# Patient Record
Sex: Male | Born: 1935
Health system: Southern US, Community
[De-identification: ages and names within clinical notes are randomized; demographics above are authoritative.]

## PROBLEM LIST (undated history)

## (undated) DIAGNOSIS — K219 Gastro-esophageal reflux disease without esophagitis: Secondary | ICD-10-CM

## (undated) DIAGNOSIS — K4090 Unilateral inguinal hernia, without obstruction or gangrene, not specified as recurrent: Secondary | ICD-10-CM

## (undated) DIAGNOSIS — C189 Malignant neoplasm of colon, unspecified: Secondary | ICD-10-CM

## (undated) DIAGNOSIS — C61 Malignant neoplasm of prostate: Secondary | ICD-10-CM

## (undated) DIAGNOSIS — N400 Enlarged prostate without lower urinary tract symptoms: Secondary | ICD-10-CM

## (undated) DIAGNOSIS — I10 Essential (primary) hypertension: Secondary | ICD-10-CM

## (undated) HISTORY — DX: Gastro-esophageal reflux disease without esophagitis: K21.9

## (undated) HISTORY — DX: Essential (primary) hypertension: I10

## (undated) HISTORY — DX: Unilateral inguinal hernia, without obstruction or gangrene, not specified as recurrent: K40.90

## (undated) HISTORY — DX: Malignant neoplasm of colon, unspecified: C18.9

## (undated) HISTORY — DX: Benign prostatic hyperplasia without lower urinary tract symptoms: N40.0

---

## 1987-04-16 HISTORY — PX: PROSTATE SURGERY: SHX751

## 2002-12-31 ENCOUNTER — Ambulatory Visit: Admission: RE | Admit: 2002-12-31 | Discharge: 2003-03-31 | Payer: Self-pay | Admitting: Radiation Oncology

## 2004-04-15 HISTORY — PX: HERNIA REPAIR: SHX51

## 2005-04-15 HISTORY — PX: COLON SURGERY: SHX602

## 2007-04-16 HISTORY — PX: HERNIA REPAIR: SHX51

## 2011-12-24 ENCOUNTER — Encounter: Payer: Self-pay | Admitting: Hematology and Oncology

## 2011-12-24 DIAGNOSIS — C20 Malignant neoplasm of rectum: Secondary | ICD-10-CM

## 2011-12-24 DIAGNOSIS — C61 Malignant neoplasm of prostate: Secondary | ICD-10-CM

## 2012-06-16 ENCOUNTER — Encounter (INDEPENDENT_AMBULATORY_CARE_PROVIDER_SITE_OTHER): Payer: Self-pay | Admitting: *Deleted

## 2012-06-16 ENCOUNTER — Encounter (INDEPENDENT_AMBULATORY_CARE_PROVIDER_SITE_OTHER): Payer: Self-pay

## 2012-07-22 ENCOUNTER — Encounter: Payer: Self-pay | Admitting: Cardiology

## 2012-08-24 ENCOUNTER — Encounter: Payer: Self-pay | Admitting: *Deleted

## 2012-08-24 ENCOUNTER — Ambulatory Visit (INDEPENDENT_AMBULATORY_CARE_PROVIDER_SITE_OTHER): Payer: Medicare Other | Admitting: Cardiology

## 2012-08-24 ENCOUNTER — Encounter: Payer: Self-pay | Admitting: Cardiology

## 2012-08-24 ENCOUNTER — Other Ambulatory Visit: Payer: Self-pay | Admitting: *Deleted

## 2012-08-24 VITALS — BP 132/78 | HR 68 | Ht 70.0 in | Wt 180.0 lb

## 2012-08-24 DIAGNOSIS — R079 Chest pain, unspecified: Secondary | ICD-10-CM

## 2012-08-24 DIAGNOSIS — R072 Precordial pain: Secondary | ICD-10-CM

## 2012-08-24 DIAGNOSIS — R0989 Other specified symptoms and signs involving the circulatory and respiratory systems: Secondary | ICD-10-CM

## 2012-08-24 DIAGNOSIS — I10 Essential (primary) hypertension: Secondary | ICD-10-CM

## 2012-08-24 NOTE — Patient Instructions (Addendum)
Your physician recommends that you continue on your current medications as directed. Please refer to the Current Medication list given to you today. Your physician has requested that you have a lexiscan myoview. For further information please visit [email or web address] Please follow instruction sheet, as given. Your physician recommends that have lab work today for BNP at [facility].  We will call you with your results.

## 2012-08-24 NOTE — Progress Notes (Signed)
HPI The patient presents for  Evaluation of dyspnea and chest discomfort.he has no prior cardiac history. He was complaining of flulike symptoms in the fall. He was short of breath with this and though this has improved he has never gotten back to baseline. He might be short of breath by the time he gets up a flight of stairs. He's been sleeping on 2 pillows at night instead of one. He's had some mild lower extremity edema. When he does activities such as works in his yard he might get slightly dyspneic. He also describes some left upper chest discomfort. This seems to happen sporadically. There may be some discomfort in the left arm. He can't correlate this with the dyspnea with activities. He does not describe any jaw discomfort. He's had no palpitations, presyncope or syncope. He's not describing classic PND.   I did review the results of an echocardiogram done by VYAS,DHRUV B., MD.  This demonstrated some mild aortic insufficiency but overall well-preserved ejection fraction and no significant valvular abnormalities.  Allergies  Allergen Reactions  . Diclofenac   . Penicillins Itching    Current Outpatient Prescriptions  Medication Sig Dispense Refill  . acetaminophen (TYLENOL) 325 MG tablet Take 650 mg by mouth every 6 (six) hours as needed for pain.      Marland Kitchen amLODipine (NORVASC) 5 MG tablet Take 5 mg by mouth daily.      . cyclobenzaprine (FLEXERIL) 10 MG tablet Take 10 mg by mouth at bedtime as needed for muscle spasms.      . Multiple Vitamins-Minerals (CENTRUM SILVER PO) Take 1 tablet by mouth daily.      . pantoprazole (PROTONIX) 40 MG tablet Take 40 mg by mouth daily.       No current facility-administered medications for this visit.    Past Medical History  Diagnosis Date  . Hypertrophy (benign) of prostate   . Inguinal hernia without mention of obstruction or gangrene, unilateral or unspecified, (not specified as recurrent)   . Esophageal reflux   . Unspecified essential  hypertension   . Colon cancer     Past Surgical History  Procedure Laterality Date  . Colon surgery  2007    [facility]  . Hernia repair  2009    [facility]  . Prostate surgery  1989    enlarge prostate at [facility]  . Hernia repair  2006    Inguinal    Family History  Problem Relation Age of Onset  . Heart attack Father     cause of death at age 92  . Breast cancer Mother   . Cancer Brother     brain tumor  . Cancer - Other Son     stomach/esophagus    History   Social History  . Marital Status: Widowed    Spouse Name: N/A    Number of Children: 3  . Years of Education: N/A   Occupational History  . Not on file.   Social History Main Topics  . Smoking status: Former Smoker -- 0.50 packs/day for 3 years    Types: Cigarettes    Start date: 04/16/1951    Quit date: 04/15/1953  . Smokeless tobacco: Never Used  . Alcohol Use: No  . Drug Use: No  . Sexually Active: Not on file   Other Topics Concern  . Not on file   Social History Narrative  . No narrative on file    ROS:  Positive for reflux, joint pains, seasonal allergies. Otherwise as  stated in the HPI and negative for all other systems.  PHYSICAL EXAM BP 132/78  Pulse 68  Ht 5\' 10"  (1.778 m)  Wt 180 lb (81.647 kg)  BMI 25.83 kg/m2  SpO2 96% GENERAL:  Well appearing HEENT:  Pupils equal round and reactive, fundi not visualized, oral mucosa unremarkable NECK:  No jugular venous distention, waveform within normal limits, carotid upstroke brisk and symmetric, no bruits, no thyromegaly LYMPHATICS:  No cervical, inguinal adenopathy LUNGS:  Clear to auscultation bilaterally BACK:  No CVA tenderness CHEST:  Unremarkable HEART:  PMI not displaced or sustained,S1 and S2 within normal limits, no S3, no S4, no clicks, no rubs, no murmurs ABD:  Flat, positive bowel sounds normal in frequency in pitch, no bruits, no rebound, no guarding, no midline pulsatile mass, no hepatomegaly, no splenomegaly,  colostomy bag EXT:  2 plus pulses throughout, no edema, no cyanosis no clubbing SKIN:  No rashes no nodules NEURO:  Cranial nerves II through XII grossly intact, motor grossly intact throughout PSYCH:  Cognitively intact, oriented to person place and time  EKG:  Sinus rhythm, rate 71, axis within normal limits, intervals within normal limits, no acute ST-T wave changes.   ASSESSMENT AND PLAN  CHEST PAIN:  The etiology of this is not clear. He does have risk factors  Particularly with his family history. Therefore, the pretest probability of obstructive coronary disease is moderate. I will screen him with a stress perfusion study. He would not be a walk on a treadmill with his dyspnea and so he will have a [employer].   DYSPNEA:  I will screen with BNP.    HTN:  He will continue on the meds as listed.   Norvasc could be explaining some of the edema.

## 2012-08-26 DIAGNOSIS — R072 Precordial pain: Secondary | ICD-10-CM | POA: Insufficient documentation

## 2012-08-27 DIAGNOSIS — R079 Chest pain, unspecified: Secondary | ICD-10-CM

## 2012-08-31 ENCOUNTER — Telehealth: Payer: Self-pay | Admitting: *Deleted

## 2012-08-31 NOTE — Telephone Encounter (Signed)
Patient informed. 

## 2012-08-31 NOTE — Telephone Encounter (Signed)
Message copied by Eustace Moore on Mon Aug 31, 2012 10:52 AM ------      Message from: Rollene Rotunda      Created: Sun Aug 30, 2012  3:10 PM       BNP was normal.  The stress perfusion study was low risk.  There is artifact vs mild ischemia.  I doubt that further work up will be needed by I will see him in follow up to discuss the results.  Call [name] with the results and send results to VYAS,DHRUV B., MD       ------

## 2012-10-08 ENCOUNTER — Other Ambulatory Visit (INDEPENDENT_AMBULATORY_CARE_PROVIDER_SITE_OTHER): Payer: Self-pay | Admitting: *Deleted

## 2012-10-08 ENCOUNTER — Telehealth (INDEPENDENT_AMBULATORY_CARE_PROVIDER_SITE_OTHER): Payer: Self-pay | Admitting: *Deleted

## 2012-10-08 DIAGNOSIS — Z860100 Personal history of colon polyps, unspecified: Secondary | ICD-10-CM

## 2012-10-08 DIAGNOSIS — Z85048 Personal history of other malignant neoplasm of rectum, rectosigmoid junction, and anus: Secondary | ICD-10-CM

## 2012-10-08 DIAGNOSIS — Z1211 Encounter for screening for malignant neoplasm of colon: Secondary | ICD-10-CM

## 2012-10-08 NOTE — Telephone Encounter (Signed)
Patient needs movi prep 

## 2012-10-12 MED ORDER — PEG-KCL-NACL-NASULF-NA ASC-C 100 G PO SOLR: 1.0000 | Freq: Once | ORAL | Status: DC

## 2012-10-21 ENCOUNTER — Encounter: Payer: Self-pay | Admitting: Cardiology

## 2012-10-21 ENCOUNTER — Ambulatory Visit (INDEPENDENT_AMBULATORY_CARE_PROVIDER_SITE_OTHER): Payer: Medicare Other | Admitting: Cardiology

## 2012-10-21 VITALS — BP 145/85 | HR 81 | Ht 70.0 in | Wt 181.1 lb

## 2012-10-21 DIAGNOSIS — R072 Precordial pain: Secondary | ICD-10-CM

## 2012-10-21 NOTE — Patient Instructions (Addendum)

## 2012-10-21 NOTE — Progress Notes (Signed)
HPI The patient presents for evaluation of dyspnea and chest discomfort and edema as described in the previous note.  I did evaluate this with stress perfusion study in the spring. This was a low risk study with some possibility of inferior ischemia this is artifact. I brought him back to see whether he was having any increasing symptoms. He is not describing any chest pressure, neck or arm discomfort. Dyspnea he was having climbing the stairs is improved. He does get somewhat short of breath walking 100 yards uphill back from his mailbox. However, he has no resting complaints such as PND or orthopnea. Shoulder discomfort that he was having gradually has improved over time.  Allergies  Allergen Reactions  . Diclofenac   . Penicillins Itching    Current Outpatient Prescriptions  Medication Sig Dispense Refill  . acetaminophen (TYLENOL) 325 MG tablet Take 650 mg by mouth every 6 (six) hours as needed for pain.      Marland Kitchen amLODipine (NORVASC) 5 MG tablet Take 5 mg by mouth daily.      . cyclobenzaprine (FLEXERIL) 10 MG tablet Take 10 mg by mouth at bedtime as needed for muscle spasms.      . Multiple Vitamins-Minerals (CENTRUM SILVER PO) Take 1 tablet by mouth daily.      . pantoprazole (PROTONIX) 40 MG tablet Take 40 mg by mouth daily.      . peg 3350 powder (MOVIPREP) 100 G SOLR Take 1 kit (100 g total) by mouth once.  1 kit  0   No current facility-administered medications for this visit.    Past Medical History  Diagnosis Date  . Hypertrophy (benign) of prostate   . Inguinal hernia without mention of obstruction or gangrene, unilateral or unspecified, (not specified as recurrent)   . Esophageal reflux   . Unspecified essential hypertension   . Colon cancer     Past Surgical History  Procedure Laterality Date  . Colon surgery  2007    [place]  . Hernia repair  2009    [facility]  . Prostate surgery  1989    [facility]  . Hernia repair  2006   Inguinal    ROS: As stated in the HPI and negative for all other systems.  PHYSICAL EXAM BP 145/85  Pulse 81  Ht 5\' 10"  (1.778 m)  Wt 181 lb 1.6 oz (82.146 kg)  BMI 25.99 kg/m2 GENERAL:  Well appearing NECK:  No jugular venous distention, waveform within normal limits, carotid upstroke brisk and symmetric, no bruits, no thyromegaly LUNGS:  Clear to auscultation bilaterally CHEST:  Unremarkable HEART:  PMI not displaced or sustained,S1 and S2 within normal limits, no S3, no S4, no clicks, no rubs, no murmurs ABD:  Flat, positive bowel sounds normal in frequency in pitch, no bruits, no rebound, no guarding, no midline pulsatile mass, no hepatomegaly, no splenomegaly, colostomy bag EXT:  2 plus pulses throughout, no edema, no cyanosis no clubbing  ASSESSMENT AND PLAN  CHEST PAIN:  He is not describing any chest discomfort and his breathing is improved. This was a low risk study. Therefore, no further imaging or change in his therapy is indicated. However, I had a discussion that if he has increasing symptoms between now and his next appointment he should let us know. At that point he would likely need invasive evaluation.  DYSPNEA:  As above. BNP was normal. I do not strongly suspect a cardiac etiology.  HTN:  He will continue on the  meds as listed.

## 2012-10-27 ENCOUNTER — Telehealth (INDEPENDENT_AMBULATORY_CARE_PROVIDER_SITE_OTHER): Payer: Self-pay | Admitting: *Deleted

## 2012-10-27 NOTE — Telephone Encounter (Signed)
agree

## 2012-10-27 NOTE — Telephone Encounter (Signed)
  Procedure: tcs  Reason/Indication:  Hx polyps, hx rectal ca  Has patient had this procedure before?  Yes, 05/2009 (scanned)  If so, when, by whom and where?    Is there a family history of colon cancer?  no  Who?  What age when diagnosed?    Is patient diabetic?   no      Does patient have prosthetic heart valve?  no  Do you have a pacemaker?  no  Has patient ever had endocarditis? no  Has patient had joint replacement within last 12 months?  no  Is patient on Coumadin, Plavix and/or Aspirin? no  Medications: centrum silver daily, amlodipine 5 mg daily, omeprezole 40 mg daily, cyclobenzaprine 10 mg daily  Allergies: pcn  Medication Adjustment:   Procedure date & time: 11/26/12 at 1030

## 2012-11-12 ENCOUNTER — Encounter (HOSPITAL_COMMUNITY): Payer: Self-pay | Admitting: Pharmacy Technician

## 2012-11-26 ENCOUNTER — Ambulatory Visit (HOSPITAL_COMMUNITY)
Admission: RE | Admit: 2012-11-26 | Discharge: 2012-11-26 | Disposition: A | Discharge status: Home / Self Care | Payer: Medicare Other | Source: Ambulatory Visit | Attending: Internal Medicine | Admitting: Internal Medicine

## 2012-11-26 ENCOUNTER — Encounter (HOSPITAL_COMMUNITY)
Admission: RE | Disposition: A | Discharge status: Home / Self Care | Payer: Self-pay | Source: Ambulatory Visit | Attending: Internal Medicine

## 2012-11-26 ENCOUNTER — Encounter (HOSPITAL_COMMUNITY): Payer: Self-pay | Admitting: *Deleted

## 2012-11-26 DIAGNOSIS — D126 Benign neoplasm of colon, unspecified: Secondary | ICD-10-CM | POA: Insufficient documentation

## 2012-11-26 DIAGNOSIS — Z800 Family history of malignant neoplasm of digestive organs: Secondary | ICD-10-CM

## 2012-11-26 DIAGNOSIS — Z860100 Personal history of colon polyps, unspecified: Secondary | ICD-10-CM | POA: Insufficient documentation

## 2012-11-26 DIAGNOSIS — Z85048 Personal history of other malignant neoplasm of rectum, rectosigmoid junction, and anus: Secondary | ICD-10-CM

## 2012-11-26 DIAGNOSIS — I10 Essential (primary) hypertension: Secondary | ICD-10-CM | POA: Insufficient documentation

## 2012-11-26 DIAGNOSIS — Z933 Colostomy status: Secondary | ICD-10-CM | POA: Insufficient documentation

## 2012-11-26 DIAGNOSIS — Z79899 Other long term (current) drug therapy: Secondary | ICD-10-CM | POA: Insufficient documentation

## 2012-11-26 DIAGNOSIS — N400 Enlarged prostate without lower urinary tract symptoms: Secondary | ICD-10-CM | POA: Insufficient documentation

## 2012-11-26 DIAGNOSIS — K219 Gastro-esophageal reflux disease without esophagitis: Secondary | ICD-10-CM | POA: Insufficient documentation

## 2012-11-26 HISTORY — PX: COLONOSCOPY: SHX5424

## 2012-11-26 SURGERY — COLONOSCOPY
Anesthesia: Moderate Sedation

## 2012-11-26 MED ORDER — SODIUM CHLORIDE 0.9 % IV SOLN
INTRAVENOUS | Status: DC
  Administered 2012-11-26: 1000 mL via INTRAVENOUS

## 2012-11-26 MED ORDER — MIDAZOLAM HCL 5 MG/5ML IJ SOLN
INTRAMUSCULAR | Status: DC | PRN
  Administered 2012-11-26: 1 mg via INTRAVENOUS
  Administered 2012-11-26: 2 mg via INTRAVENOUS

## 2012-11-26 MED ORDER — MEPERIDINE HCL 50 MG/ML IJ SOLN
INTRAMUSCULAR | Status: DC | PRN
  Administered 2012-11-26: 25 mg via INTRAVENOUS

## 2012-11-26 MED ORDER — STERILE WATER FOR IRRIGATION IR SOLN
Status: DC | PRN
  Administered 2012-11-26: 11:00:00

## 2012-11-26 MED ORDER — MEPERIDINE HCL 50 MG/ML IJ SOLN
INTRAMUSCULAR | Status: AC
  Filled 2012-11-26: qty 1

## 2012-11-26 MED ORDER — MIDAZOLAM HCL 5 MG/5ML IJ SOLN
INTRAMUSCULAR | Status: AC
  Filled 2012-11-26: qty 10

## 2012-11-26 NOTE — H&P (Signed)
Joshua Wilkerson is an 79 y.o. male.   Chief Complaint: Patient is here for colonoscopy. HPI: Patient is a 19-year-old African male with history of rectal adenocarcinoma: Status post APR in 2007 followed by chemotherapy. He has remained in remission. Last colonoscopy was in February 2011 with removal of small polyp which was an adenoma. He denies melena or bleeding colostomy. However he did notice cannibal of blood this morning the patient colostomy bag. Family history is significant for colon carcinoma in her brother who is two years younger and is doing fine.  Past Medical History  Diagnosis Date  . Hypertrophy (benign) of prostate   . Inguinal hernia without mention of obstruction or gangrene, unilateral or unspecified, (not specified as recurrent)   . Esophageal reflux   . Unspecified essential hypertension   . Colon cancer     Past Surgical History  Procedure Laterality Date  . Colon surgery  2007    [place]  . Hernia repair  2009    Umb[facility]ical  . Prostate surgery  1989    enlarge prostate [facility]  . Hernia repair  2006    Inguinal    Family History  Problem Relation Age of Onset  . Heart attack Father     cause of death at age 64  . Breast cancer Mother   . Cancer Brother     brain tumor  . Cancer - Other Son     stomach/esophagus   Social History:  reports that he quit smoking about 59 years ago. His smoking use included Cigarettes. He started smoking about 61 years ago. He has a 1.5 pack-year smoking history. He has never used smokeless tobacco. He reports that he does not drink alcohol or use illicit drugs.  Allergies:  Allergies  Allergen Reactions  . Diclofenac   . Penicillins Itching    Medications Prior to Admission  Medication Sig Dispense Refill  . acetaminophen (TYLENOL) 325 MG tablet Take 325 mg by mouth every 6 (six) hours as needed for pain.       Marland Kitchen amLODipine (NORVASC) 5 MG tablet Take 5 mg by mouth daily.      . calcium carbonate (TUMS -  DOSED IN MG ELEMENTAL CALCIUM) 500 MG chewable tablet Chew 1 tablet by mouth daily as needed for heartburn.      . cyclobenzaprine (FLEXERIL) 10 MG tablet Take 10 mg by mouth at bedtime as needed for muscle spasms.      . Multiple Vitamins-Minerals (CENTRUM SILVER PO) Take 1 tablet by mouth daily.      . pantoprazole (PROTONIX) 40 MG tablet Take 40 mg by mouth daily.        No results found for this or any previous visit (from the past 48 hour(s)). No results found.  ROS  Pulse 71, temperature 97.6 F (36.4 C), temperature source Oral, resp. rate 20, height 5\' 10"  (1.778 m), weight 181 lb (82.101 kg), SpO2 94.00%. Physical Exam  Constitutional: He appears well-developed and well-nourished.  HENT:  Mouth/Throat: Oropharynx is clear and moist.  Eyes: Conjunctivae are normal. No scleral icterus.  Neck: No thyromegaly present.  Cardiovascular: Normal rate, regular rhythm and normal heart sounds.   No murmur heard. Respiratory: Effort normal and breath sounds normal.  GI: Soft. He exhibits no distension. There is no tenderness.  Colostomy located in left lower quadrant.  Musculoskeletal: He exhibits no edema.  Lymphadenopathy:    He has no cervical adenopathy.  Neurological: He is alert.  Skin: Skin is  warm and dry.     Assessment/Plan History of rectal adenocarcinoma. Surveillance colonoscopy.  Laruen Risser U 11/26/2012, 10:40 AM

## 2012-11-26 NOTE — Op Note (Addendum)
COLONOSCOPY PROCEDURE REPORT  PATIENT:  Joshua Wilkerson  MR#:  [id number] Birthdate:  August 04, 1927, 79 y.o., male Endoscopist:  Dr. Malissa Hippo, MD Referred By:  Dr. Ignatius Specking, MD Procedure Date: 11/26/2012  Procedure:   Colonoscopy  Indications: Patient is a 79 year old African male with history of rectal adenocarcinoma status post APR in 2007 who is here for surveillance colonoscopy. His last exam was in February 2011 with removal of small tubular adenoma.  Informed Consent:  The procedure and risks were reviewed with the patient and informed consent was obtained.  Medications:  Demerol 25 mg IV Versed 3 mg IV  Description of procedure: Patient was placed in supine position and digital exam of the colostomy was performed. No abnormality noted. The gastric Pantex colonoscope was placed into the colostomy and advanced to the area of the cecum, ileocecal valve and appendiceal orifice. The cecum was deeply intubated. These structures were well-seen and photographed for the record. From the level of the cecum and ileocecal valve, the scope was slowly and cautiously withdrawn. The mucosal surfaces were carefully surveyed utilizing scope tip to flexion to facilitate fold flattening as needed.  Findings:   Prep excellent. 3 mm polyps ablated via cold biopsy from transverse colon. Mucosa rest of the colon was normal.    Therapeutic/Diagnostic Maneuvers Performed:  See above  Complications:  None  Cecal Withdrawal Time:  11 minutes  Impression:  Examination performed to cecum via sigmoid colostomy. 3 mm polyps ablated via cold biopsy from transverse colon. No other abnormalities noted.  Recommendations:  Standard instructions given. I will be contacting patient with results of biopsy. He may consider another exam in 4 years as long as he remains in good health.  REHMAN,NAJEEB U  11/26/2012 11:12 AM  CC: Dr. Ignatius Specking., MD & Dr. Bonnetta Barry ref. provider found

## 2012-12-02 ENCOUNTER — Encounter (HOSPITAL_COMMUNITY): Payer: Self-pay | Admitting: Internal Medicine

## 2012-12-07 ENCOUNTER — Encounter (INDEPENDENT_AMBULATORY_CARE_PROVIDER_SITE_OTHER): Payer: Self-pay | Admitting: *Deleted

## 2013-01-15 DIAGNOSIS — Z8546 Personal history of malignant neoplasm of prostate: Secondary | ICD-10-CM

## 2013-01-15 DIAGNOSIS — Z85048 Personal history of other malignant neoplasm of rectum, rectosigmoid junction, and anus: Secondary | ICD-10-CM

## 2013-11-23 ENCOUNTER — Encounter: Payer: Self-pay | Admitting: Internal Medicine

## 2013-11-23 ENCOUNTER — Ambulatory Visit (INDEPENDENT_AMBULATORY_CARE_PROVIDER_SITE_OTHER): Payer: Medicare Other | Admitting: Cardiology

## 2013-11-23 ENCOUNTER — Encounter: Payer: Self-pay | Admitting: Cardiology

## 2013-11-23 VITALS — BP 144/82 | HR 62 | Ht 70.0 in | Wt 171.0 lb

## 2013-11-23 DIAGNOSIS — R072 Precordial pain: Secondary | ICD-10-CM

## 2013-11-23 DIAGNOSIS — I10 Essential (primary) hypertension: Secondary | ICD-10-CM

## 2013-11-23 NOTE — Assessment & Plan Note (Signed)
History of atypical thoracic discomfort as outlined above. Relatively low risk Cardiolite noted, no progression in symptoms. ECG is normal. Continue observation.

## 2013-11-23 NOTE — Progress Notes (Signed)
Clinical Summary [name] is an 80 y.o.male former patient of Dr. Percival Spanish, last seen in July 2014. His wife is my patient, Joshua Wilkerson. He has been followed with a history of chest pain and shortness of breath, prior overall low risk ischemic workup.  Lexiscan Cardiolite from May 2014 showed no diagnostic ST segment changes, moderate sized area of partial reversibility in the inferior wall suggestive of either variable soft tissue attenuation versus ischemia, LVEF 64%.  Echocardiogram done at [facility] in April 2014 revealed mild LVH with LVEF 28-31%, diastolic dysfunction, trace mitral regurgitation, mild aortic regurgitation, mild tricuspid regurgitation.  ECG today shows normal sinus rhythm. He does not report any exertional angina or limiting shortness of breath. Occasionally feels a tightness in his ankles in the mornings, no definitive orthopnea or PND. The chest discomfort that he describes to me is a very focal, sharp pain in his left shoulder area that he can reproduce with touch.   Allergies  Allergen Reactions  . Diclofenac   . Penicillins Itching    Current Outpatient Prescriptions  Medication Sig Dispense Refill  . acetaminophen (TYLENOL) 325 MG tablet Take 325 mg by mouth every 6 (six) hours as needed for pain.       Marland Kitchen amLODipine (NORVASC) 5 MG tablet Take 5 mg by mouth daily.      . Multiple Vitamins-Minerals (CENTRUM SILVER PO) Take 1 tablet by mouth daily.      . pantoprazole (PROTONIX) 40 MG tablet Take 40 mg by mouth daily.       No current facility-administered medications for this visit.    Past Medical History  Diagnosis Date  . BPH (benign prostatic hyperplasia)   . Inguinal hernia   . Esophageal reflux   . Essential hypertension, benign   . Colon cancer     Past Surgical History  Procedure Laterality Date  . Colon surgery  2007    [facility]  . Hernia repair  [id number]    [facility]  . Prostate surgery  1989    [facility]  . Hernia repair   2006    Inguinal  . Colonoscopy N/A 11/26/2012    Procedure: COLONOSCOPY;  Surgeon: Rogene Houston, MD;  Location: [facility];  Service: Endoscopy;  Laterality: N/A;    Family History  Problem Relation Age of Onset  . Heart attack Father     Cause of death at age 54  . Breast cancer Mother   . Cancer Brother     Brain tumor  . Cancer - Other Son     Stomach/esophagus    Social History [name] reports that he quit smoking about 60 years ago. His smoking use included Cigarettes. He started smoking about 62 years ago. He has a 1.5 pack-year smoking history. He has never used smokeless tobacco. [name] reports that he does not drink alcohol.  Review of Systems No palpitations, dizziness, syncope. No orthopnea or PND. Other systems reviewed and negative except as outlined.  Physical Examination Filed Vitals:   11/23/13 0943  BP: 144/82  Pulse: 62   Filed Weights   11/23/13 0943  Weight: 171 lb (77.565 kg)   Normally nourished appearing male, comfortable at rest. HEENT: Conjunctiva and lids normal, oropharynx clear. Neck: Supple, no elevated JVP or carotid bruits, no thyromegaly. Lungs: Clear to auscultation, nonlabored breathing at rest. Cardiac: Regular rate and rhythm, no S3 or significant systolic murmur, no pericardial rub. Abdomen: Soft, nontender, bowel sounds present, no guarding or  rebound. Extremities: Trace ankle edema, distal pulses 2+. Skin: Warm and dry. Musculoskeletal: No kyphosis. Neuropsychiatric: Alert and oriented x3, affect grossly appropriate.   Problem List and Plan   Precordial pain History of atypical thoracic discomfort as outlined above. Relatively low risk Cardiolite noted, no progression in symptoms. ECG is normal. Continue observation.  Essential hypertension, benign No change to current regimen, keep followup with Dr. Woody Seller.    Satira Sark, M.D., F.A.C.C.

## 2013-11-23 NOTE — Patient Instructions (Signed)

## 2013-11-23 NOTE — Assessment & Plan Note (Signed)
No change to current regimen, keep followup with Dr. Woody Seller.

## 2013-11-26 ENCOUNTER — Telehealth: Payer: Self-pay | Admitting: *Deleted

## 2013-11-26 NOTE — Telephone Encounter (Signed)
Wife informed

## 2013-11-26 NOTE — Telephone Encounter (Signed)
Message copied by Merlene Laughter on Fri Nov 26, 2013  9:42 AM ------      Message from: Satira Sark      Created: Tue Nov 23, 2013 10:59 AM       Patient seen in the office earlier today for a followup visit. Reviewed echocardiogram report. LVEF normal at 06-00% with diastolic dysfunction. No major valvular abnormalities. Mild aortic and tricuspid regurgitation. No change in current plan. ------

## 2014-11-28 ENCOUNTER — Ambulatory Visit: Payer: Medicare Other | Admitting: Cardiology

## 2014-11-29 ENCOUNTER — Ambulatory Visit (INDEPENDENT_AMBULATORY_CARE_PROVIDER_SITE_OTHER): Payer: Medicare Other | Admitting: Cardiology

## 2014-11-29 ENCOUNTER — Encounter: Payer: Self-pay | Admitting: *Deleted

## 2014-11-29 ENCOUNTER — Encounter: Payer: Self-pay | Admitting: Cardiology

## 2014-11-29 VITALS — BP 122/78 | HR 75 | Ht 68.0 in | Wt 177.0 lb

## 2014-11-29 DIAGNOSIS — I208 Other forms of angina pectoris: Secondary | ICD-10-CM

## 2014-11-29 DIAGNOSIS — I10 Essential (primary) hypertension: Secondary | ICD-10-CM | POA: Diagnosis not present

## 2014-11-29 NOTE — Progress Notes (Signed)
Cardiology Office Note  Date: 11/29/2014   ID: Joshua Wilkerson, DOB 01-10-1928, MRN [id number]  PCP: Glenda Chroman., MD  Primary Cardiologist: Rozann Lesches, MD   Chief Complaint  Patient presents with  . Cardiac follow-up    History of Present Illness: Joshua Wilkerson is an 81 y.o. male last seen in August 2015. He presents for a routine follow-up visit. Since we last met, he states that he has noticed more of a sense of exertional shortness of breath and some chest tightness, particularly when he walks at a quick pace or goes uphill. Prior cardiac testing includes a Cardiolite study from May 2014 that showed a partially reversible inferior defect consistent with either variable soft tissue attenuation or mild ischemia, LVEF was 64%. He has been managed medically since that time.  He walks in the morning up to get his paper every day, tries to stay active with other ADLs. He reports compliance with his medications which are outlined below.  Follow-up ECG today is reviewed.   Past Medical History  Diagnosis Date  . BPH (benign prostatic hyperplasia)   . Inguinal hernia   . Esophageal reflux   . Essential hypertension, benign   . Colon cancer     Past Surgical History  Procedure Laterality Date  . Colon surgery  2007    [facility]  . Hernia repair  4627    Umbilical  . Prostate surgery  1989    [facility]  . Hernia repair  2006    Inguinal  . Colonoscopy N/A 11/26/2012    Procedure: COLONOSCOPY;  Surgeon: Rogene Houston, MD;  [facility];  Service: Endoscopy;  Laterality: N/A;    Current Outpatient Prescriptions  Medication Sig Dispense Refill  . acetaminophen (TYLENOL) 325 MG tablet Take 325 mg by mouth every 6 (six) hours as needed for pain.     Marland Kitchen amLODipine (NORVASC) 5 MG tablet Take 5 mg by mouth daily.    . Multiple Vitamins-Minerals (CENTRUM SILVER PO) Take 1 tablet by mouth daily.    Marland Kitchen UNKNOWN TO PATIENT Acid reflux med 20 mg daily    . UNKNOWN TO  PATIENT Cholesterol med daily     No current facility-administered medications for this visit.    Allergies:  Diclofenac and Penicillins   Social History: The patient  reports that he quit smoking about 61 years ago. His smoking use included Cigarettes. He started smoking about 63 years ago. He has a 1.5 pack-year smoking history. He has never used smokeless tobacco. He reports that he does not drink alcohol or use illicit drugs.   ROS:  Please see the history of present illness. Otherwise, complete review of systems is positive for none.  All other systems are reviewed and negative.   Physical Exam: VS:  BP 122/78 mmHg  Pulse 75  Ht 5' 8"  (1.727 m)  Wt 177 lb (80.287 kg)  BMI 26.92 kg/m2  SpO2 98%, BMI Body mass index is 26.92 kg/(m^2).  Wt Readings from Last 3 Encounters:  11/29/14 177 lb (80.287 kg)  11/23/13 171 lb (77.565 kg)  11/26/12 181 lb (82.101 kg)     Normally nourished appearing male, comfortable at rest. HEENT: Conjunctiva and lids normal, oropharynx clear. Neck: Supple, no elevated JVP or carotid bruits, no thyromegaly. Lungs: Clear to auscultation, nonlabored breathing at rest. Cardiac: Regular rate and rhythm, no S3 or significant systolic murmur, no pericardial rub. Abdomen: Soft, nontender, bowel sounds present, no guarding or rebound. Extremities: Trace  ankle edema, distal pulses 2+. Skin: Warm and dry. Musculoskeletal: No kyphosis. Neuropsychiatric: Alert and oriented x3, affect grossly appropriate.   ECG: ECG is ordered today and shows sinus rhythm with nondiagnostic Q in lead 3.  Other Studies Reviewed Today:  Lexiscan Cardiolite from May 2014 showed no diagnostic ST segment changes, moderate sized area of partial reversibility in the inferior wall suggestive of either variable soft tissue attenuation versus ischemia, LVEF 64%.  Echocardiogram done at [facility] in April 2014 revealed mild LVH with LVEF 83-66%, diastolic dysfunction, trace  mitral regurgitation, mild aortic regurgitation, mild tricuspid regurgitation.  Assessment and Plan:  1. Increased sense of exertional shortness of breath and chest tightness, likely angina based on description. ECG is overall nonspecific. He had a low risk but somewhat equivocal Cardiolite in 2014. Plan will be to follow-up with an exercise Cardiolite to reassess ischemic burden and determine whether we need to pursue further evaluation or continue observation.  2. Essential hypertension, blood pressure control is good today.  Current medicines were reviewed with the patient today.   Orders Placed This Encounter  Procedures  . NM Myocar Multi W/Spect W/Wall Motion / EF  . Myocardial Perfusion Imaging  . EKG 12-Lead    Disposition: Follow-up tests results and determine disposition.   Signed, Satira Sark, MD, [facility] 11/29/2014 11:19 AM    [place] [facility] [place], [place], [place] [address] Phone: [phone]; Fax: [phone]

## 2014-11-29 NOTE — Patient Instructions (Signed)
Your physician recommends that you continue on your current medications as directed. Please refer to the Current Medication list given to you today. Your physician has requested that you have en exercise stress myoview. For further information please visit [email or web address]. Please follow instruction sheet, as given. Your physician recommends that you schedule a follow-up appointment in: 1 year. You will receive a reminder letter in the mail in about 10 months reminding you to call and schedule your appointment. If you don't receive this letter, please contact our office.

## 2014-12-12 ENCOUNTER — Encounter (HOSPITAL_COMMUNITY): Payer: Self-pay

## 2014-12-12 ENCOUNTER — Encounter (HOSPITAL_COMMUNITY)
Admission: RE | Admit: 2014-12-12 | Discharge: 2014-12-12 | Disposition: A | Discharge status: Home / Self Care | Payer: Medicare Other | Source: Ambulatory Visit | Attending: Cardiology | Admitting: Cardiology

## 2014-12-12 ENCOUNTER — Inpatient Hospital Stay (HOSPITAL_COMMUNITY): Admission: RE | Admit: 2014-12-12 | Payer: Medicare Other | Source: Ambulatory Visit

## 2014-12-12 DIAGNOSIS — I208 Other forms of angina pectoris: Secondary | ICD-10-CM | POA: Diagnosis present

## 2014-12-12 HISTORY — DX: Malignant neoplasm of prostate: C61

## 2014-12-12 LAB — NM MYOCAR MULTI W/SPECT W/WALL MOTION / EF
CHL CUP MPHR: 135 {beats}/min
CHL CUP NUCLEAR SRS: 6
CHL CUP RESTING HR STRESS: 54 {beats}/min
CSEPEDS: 35 s
CSEPHR: 88 %
Estimated workload: 4.6 METS
Exercise duration (min): 3 min
LHR: 0.31
LV sys vol: 23 mL
LVDIAVOL: 76 mL
NUC STRESS TID: 0.87
Peak HR: 120 {beats}/min
RPE: 14
SDS: 2
SSS: 8

## 2014-12-12 MED ORDER — TECHNETIUM TC 99M SESTAMIBI - CARDIOLITE
10.0000 | Freq: Once | INTRAVENOUS | Status: AC | PRN
  Administered 2014-12-12: 10:00:00 9 via INTRAVENOUS

## 2014-12-12 MED ORDER — SODIUM CHLORIDE 0.9 % IJ SOLN
INTRAMUSCULAR | Status: AC
  Administered 2014-12-12: 10 mL via INTRAVENOUS
  Filled 2014-12-12: qty 3

## 2014-12-12 MED ORDER — REGADENOSON 0.4 MG/5ML IV SOLN
INTRAVENOUS | Status: AC
  Filled 2014-12-12: qty 5

## 2014-12-12 MED ORDER — TECHNETIUM TC 99M SESTAMIBI GENERIC - CARDIOLITE
30.0000 | Freq: Once | INTRAVENOUS | Status: AC | PRN
  Administered 2014-12-12: 30 via INTRAVENOUS

## 2014-12-14 ENCOUNTER — Telehealth: Payer: Self-pay | Admitting: *Deleted

## 2014-12-14 NOTE — Telephone Encounter (Signed)
-----   Message from Satira Sark, MD sent at 12/12/2014  1:25 PM EDT ----- Reviewed report. Overall low risk study without ischemia and normal LVEF. Unless his symptoms worsen, would continue observation on medical therapy.

## 2014-12-14 NOTE — Telephone Encounter (Signed)
Pt aware, routed to pcp 

## 2015-04-28 DIAGNOSIS — Z125 Encounter for screening for malignant neoplasm of prostate: Secondary | ICD-10-CM | POA: Diagnosis not present

## 2015-04-28 DIAGNOSIS — R5383 Other fatigue: Secondary | ICD-10-CM | POA: Diagnosis not present

## 2015-04-28 DIAGNOSIS — Z0000 Encounter for general adult medical examination without abnormal findings: Secondary | ICD-10-CM | POA: Diagnosis not present

## 2015-04-28 DIAGNOSIS — E7800 Pure hypercholesterolemia, unspecified: Secondary | ICD-10-CM | POA: Diagnosis not present

## 2015-04-28 DIAGNOSIS — Z1389 Encounter for screening for other disorder: Secondary | ICD-10-CM | POA: Diagnosis not present

## 2015-04-28 DIAGNOSIS — Z7189 Other specified counseling: Secondary | ICD-10-CM | POA: Diagnosis not present

## 2015-04-28 DIAGNOSIS — Z1211 Encounter for screening for malignant neoplasm of colon: Secondary | ICD-10-CM | POA: Diagnosis not present

## 2015-04-28 DIAGNOSIS — Z6826 Body mass index (BMI) 26.0-26.9, adult: Secondary | ICD-10-CM | POA: Diagnosis not present

## 2015-04-28 DIAGNOSIS — Z79899 Other long term (current) drug therapy: Secondary | ICD-10-CM | POA: Diagnosis not present

## 2015-04-28 DIAGNOSIS — Z418 Encounter for other procedures for purposes other than remedying health state: Secondary | ICD-10-CM | POA: Diagnosis not present

## 2015-07-06 DIAGNOSIS — N400 Enlarged prostate without lower urinary tract symptoms: Secondary | ICD-10-CM | POA: Diagnosis not present

## 2015-07-06 DIAGNOSIS — I10 Essential (primary) hypertension: Secondary | ICD-10-CM | POA: Diagnosis not present

## 2015-07-06 DIAGNOSIS — Z789 Other specified health status: Secondary | ICD-10-CM | POA: Diagnosis not present

## 2015-07-06 DIAGNOSIS — M542 Cervicalgia: Secondary | ICD-10-CM | POA: Diagnosis not present

## 2015-07-06 DIAGNOSIS — F0390 Unspecified dementia without behavioral disturbance: Secondary | ICD-10-CM | POA: Diagnosis not present

## 2015-08-25 DIAGNOSIS — Z299 Encounter for prophylactic measures, unspecified: Secondary | ICD-10-CM | POA: Diagnosis not present

## 2015-08-25 DIAGNOSIS — Z789 Other specified health status: Secondary | ICD-10-CM | POA: Diagnosis not present

## 2015-08-25 DIAGNOSIS — J069 Acute upper respiratory infection, unspecified: Secondary | ICD-10-CM | POA: Diagnosis not present

## 2015-09-05 DIAGNOSIS — F0390 Unspecified dementia without behavioral disturbance: Secondary | ICD-10-CM | POA: Diagnosis not present

## 2015-09-05 DIAGNOSIS — I10 Essential (primary) hypertension: Secondary | ICD-10-CM | POA: Diagnosis not present

## 2015-09-12 DIAGNOSIS — E7800 Pure hypercholesterolemia, unspecified: Secondary | ICD-10-CM | POA: Diagnosis not present

## 2015-09-12 DIAGNOSIS — I10 Essential (primary) hypertension: Secondary | ICD-10-CM | POA: Diagnosis not present

## 2015-09-13 DIAGNOSIS — Z299 Encounter for prophylactic measures, unspecified: Secondary | ICD-10-CM | POA: Diagnosis not present

## 2015-09-13 DIAGNOSIS — H6120 Impacted cerumen, unspecified ear: Secondary | ICD-10-CM | POA: Diagnosis not present

## 2015-10-04 DIAGNOSIS — F0390 Unspecified dementia without behavioral disturbance: Secondary | ICD-10-CM | POA: Diagnosis not present

## 2015-10-04 DIAGNOSIS — I10 Essential (primary) hypertension: Secondary | ICD-10-CM | POA: Diagnosis not present

## 2015-10-04 DIAGNOSIS — Z299 Encounter for prophylactic measures, unspecified: Secondary | ICD-10-CM | POA: Diagnosis not present

## 2015-10-04 DIAGNOSIS — R079 Chest pain, unspecified: Secondary | ICD-10-CM | POA: Diagnosis not present

## 2015-10-19 ENCOUNTER — Encounter: Payer: Self-pay | Admitting: Cardiology

## 2015-10-19 ENCOUNTER — Ambulatory Visit (INDEPENDENT_AMBULATORY_CARE_PROVIDER_SITE_OTHER): Payer: Medicare Other | Admitting: Cardiology

## 2015-10-19 VITALS — BP 143/67 | HR 66 | Ht 68.0 in | Wt 158.6 lb

## 2015-10-19 DIAGNOSIS — Z889 Allergy status to unspecified drugs, medicaments and biological substances status: Secondary | ICD-10-CM | POA: Diagnosis not present

## 2015-10-19 DIAGNOSIS — I208 Other forms of angina pectoris: Secondary | ICD-10-CM

## 2015-10-19 DIAGNOSIS — I10 Essential (primary) hypertension: Secondary | ICD-10-CM | POA: Diagnosis not present

## 2015-10-19 DIAGNOSIS — Z789 Other specified health status: Secondary | ICD-10-CM

## 2015-10-19 NOTE — Patient Instructions (Signed)

## 2015-10-19 NOTE — Progress Notes (Signed)
Cardiology Office Note  Date: 10/19/2015   ID: Joshua Wilkerson, DOB 10-20-1927, MRN [id number]  PCP: Joshua Chroman, MD  Primary Cardiologist: Joshua Lesches, MD   Chief Complaint  Patient presents with  . Cardiac follow-up    History of Present Illness: Joshua Wilkerson is an 82 y.o. male last seen in August 2016. At that time we discussed a follow-up exercise Cardiolite to reassess ischemic burden with symptoms of exertional shortness of breath and probable angina. This study was overall low risk without clear evidence of ischemia.  He comes in today with a family member, his wife just recently passed away earlier in the week. He seems to be grieving appropriately, actually reports much less of a feeling of stress and anxiety since she is at peace now. He had been experiencing recent angina symptoms in the setting of substantial stress with poor sleep and diet. These have resolved.  We reviewed his medications. He stopped Lipitor with concerns about side effects, leg weakness and fatigue. This has improved off the medication. He was only taking Lipitor 10 mg daily.  Past Medical History  Diagnosis Date  . BPH (benign prostatic hyperplasia)   . Inguinal hernia   . Esophageal reflux   . Essential hypertension, benign   . Colon cancer ([place])   . Prostate cancer [facility])     Current Outpatient Prescriptions  Medication Sig Dispense Refill  . acetaminophen (TYLENOL) 325 MG tablet Take 325 mg by mouth every 6 (six) hours as needed for pain.     Marland Kitchen amLODipine (NORVASC) 5 MG tablet Take 5 mg by mouth daily.    . Multiple Vitamins-Minerals (CENTRUM SILVER PO) Take 1 tablet by mouth daily.    Marland Kitchen omeprazole (PRILOSEC) 20 MG capsule Take 20 mg by mouth daily.     No current facility-administered medications for this visit.   Allergies:  Diclofenac and Penicillins   Social History: The patient  reports that he quit smoking about 62 years ago. His smoking use included Cigarettes. He started  smoking about 64 years ago. He has a 1.5 pack-year smoking history. He has never used smokeless tobacco. He reports that he does not drink alcohol or use illicit drugs.   ROS:  Please see the history of present illness. Otherwise, complete review of systems is positive for fatigue.  All other systems are reviewed and negative.   Physical Exam: VS:  BP 143/67 mmHg  Pulse 66  Ht 5\' 8"  (1.727 m)  Wt 158 lb 9.6 oz (71.94 kg)  BMI 24.12 kg/m2  SpO2 98%, BMI Body mass index is 24.12 kg/(m^2).  Wt Readings from Last 3 Encounters:  10/19/15 158 lb 9.6 oz (71.94 kg)  11/29/14 177 lb (80.287 kg)  11/23/13 171 lb (77.565 kg)    Normally nourished appearing male, comfortable at rest. HEENT: Conjunctiva and lids normal, oropharynx clear. Neck: Supple, no elevated JVP or carotid bruits, no thyromegaly. Lungs: Clear to auscultation, nonlabored breathing at rest. Cardiac: Regular rate and rhythm, no S3 or significant systolic murmur, no pericardial rub. Abdomen: Soft, nontender, bowel sounds present, no guarding or rebound. Extremities: Trace ankle edema, distal pulses 2+.  ECG: I personally reviewed the tracing from 11/29/2014 which showed sinus rhythm with nondiagnostic Q in lead III.  Other Studies Reviewed Today:  Exercise Cardiolite 12/12/2014:  There was no ST segment deviation noted during stress.  The study is normal. There are no defects to suggest infarct or ischemia  This is a low risk  study.  The left ventricular ejection fraction is hyperdynamic (>65%).  Echocardiogram [facility]) April 2014: Mild LVH with LVEF 0000000, diastolic dysfunction, trace mitral regurgitation, mild aortic regurgitation, mild tricuspid regurgitation.  Assessment and Plan:  1. History of angina symptoms and fatigue, reported to be better at this time. His last stress test in August 2016 was low risk without clear evidence of scar or ischemia. He has had substantial psychological stress over  the last few years with the declining health for this wife, she passed away earlier this week. We will plan to keep an eye on him and if symptoms recur with regularity consider follow-up testing.  2. Essential hypertension, no changes to present regimen, keep follow-up with Joshua Wilkerson.  3. Intolerance to Lipitor.  Current medicines were reviewed with the patient today.  Disposition: Follow-up with be in 6 months.  Signed, Joshua Sark, MD, [facility] 10/19/2015 12:01 PM    [place] at [place], [place], [place] [address] Phone: [phone]; Fax: [phone]

## 2015-11-30 ENCOUNTER — Encounter (INDEPENDENT_AMBULATORY_CARE_PROVIDER_SITE_OTHER): Payer: Self-pay | Admitting: Internal Medicine

## 2015-12-14 DIAGNOSIS — E7800 Pure hypercholesterolemia, unspecified: Secondary | ICD-10-CM | POA: Diagnosis not present

## 2015-12-14 DIAGNOSIS — I10 Essential (primary) hypertension: Secondary | ICD-10-CM | POA: Diagnosis not present

## 2015-12-25 DIAGNOSIS — Z23 Encounter for immunization: Secondary | ICD-10-CM | POA: Diagnosis not present

## 2016-02-12 DIAGNOSIS — I10 Essential (primary) hypertension: Secondary | ICD-10-CM | POA: Diagnosis not present

## 2016-02-12 DIAGNOSIS — E7800 Pure hypercholesterolemia, unspecified: Secondary | ICD-10-CM | POA: Diagnosis not present

## 2016-03-05 DIAGNOSIS — I10 Essential (primary) hypertension: Secondary | ICD-10-CM | POA: Diagnosis not present

## 2016-03-05 DIAGNOSIS — E7800 Pure hypercholesterolemia, unspecified: Secondary | ICD-10-CM | POA: Diagnosis not present

## 2016-03-14 DIAGNOSIS — Z8546 Personal history of malignant neoplasm of prostate: Secondary | ICD-10-CM | POA: Diagnosis not present

## 2016-03-19 ENCOUNTER — Encounter (INDEPENDENT_AMBULATORY_CARE_PROVIDER_SITE_OTHER): Payer: Self-pay | Admitting: *Deleted

## 2016-03-19 ENCOUNTER — Encounter (INDEPENDENT_AMBULATORY_CARE_PROVIDER_SITE_OTHER): Payer: Self-pay | Admitting: Internal Medicine

## 2016-03-19 ENCOUNTER — Encounter (INDEPENDENT_AMBULATORY_CARE_PROVIDER_SITE_OTHER): Payer: Self-pay

## 2016-03-19 ENCOUNTER — Ambulatory Visit (INDEPENDENT_AMBULATORY_CARE_PROVIDER_SITE_OTHER): Payer: Medicare Other | Admitting: Internal Medicine

## 2016-03-19 VITALS — BP 118/70 | HR 63 | Temp 97.4°F | Resp 18 | Ht 70.0 in | Wt 156.1 lb

## 2016-03-19 DIAGNOSIS — I208 Other forms of angina pectoris: Secondary | ICD-10-CM

## 2016-03-19 DIAGNOSIS — Z85038 Personal history of other malignant neoplasm of large intestine: Secondary | ICD-10-CM

## 2016-03-19 DIAGNOSIS — R1310 Dysphagia, unspecified: Secondary | ICD-10-CM

## 2016-03-19 DIAGNOSIS — R1319 Other dysphagia: Secondary | ICD-10-CM

## 2016-03-19 MED ORDER — DOCUSATE SODIUM 100 MG PO CAPS: 200.0000 mg | ORAL_CAPSULE | Freq: Every day | ORAL | 0 refills | Status: AC

## 2016-03-19 NOTE — Patient Instructions (Addendum)
Physician will call with results of esophagogram when completed. If abdominal  griping does not improve with stool softener will consider colonoscopy otherwise wait 1 year

## 2016-03-19 NOTE — Progress Notes (Signed)
Presenting complaint;  History of colon cancer. Patient complains of dysphagia.  Database and Subjective:  Patient is 82 year old African-American male who is here for scheduled visit. He has history of colon carcinoma. He underwent APR in 2007. He has been undergoing periodic colonoscopies. Last exam was in August 2014 with removal of single small polyp from transverse colon and it was tubular adenoma. It was decided to see him in the office in 3 years determine whether he should undergo further colonoscopies. He states he has had rough few months. He lost his wife in July 2017. He lost 13 pounds. He states his appetite has improved and weight loss has leveled off. He denies melena or bleeding into colostomy. He has noted some discomfort/right pain on the left side that he's having a bowel movement. This symptom is bothersome to him. He also complains of dysphagia primarily to solids. This started 6 months ago. This symptom is intermittent. He points to upper sternum area site of bolus obstruction. He occasionally has difficulty with liquids if he takes a big gulp. Heartburn is well controlled with PPI. He takes Tums occasionally at night. He also complains of burning involving his feet and distal third of his legs. He is not having any difficulty ambulating. He lives alone. He stays busy and does yard work.      Current Medications: Outpatient Encounter Prescriptions as of 03/19/2016  Medication Sig  . acetaminophen (TYLENOL) 325 MG tablet Take 325 mg by mouth every 6 (six) hours as needed for pain.   Marland Kitchen amLODipine (NORVASC) 5 MG tablet Take 5 mg by mouth daily.  Marland Kitchen atorvastatin (LIPITOR) 10 MG tablet Take 10 mg by mouth daily.   Marland Kitchen donepezil (ARICEPT) 5 MG tablet Take 5 mg by mouth at bedtime.   . Multiple Vitamins-Minerals (CENTRUM SILVER PO) Take 1 tablet by mouth daily.  Marland Kitchen omeprazole (PRILOSEC) 20 MG capsule Take 20 mg by mouth daily.   No facility-administered encounter medications on  file as of 03/19/2016.      Objective: Blood pressure 118/70, pulse 63, temperature 97.4 F (36.3 C), temperature source Oral, resp. rate 18, height 5\' 10"  (1.778 m), weight 156 lb 1.6 oz (70.8 kg). Patient is alert and in no acute distress. Conjunctiva is pink. Sclera is nonicteric Oropharyngeal mucosa is normal. No neck masses or thyromegaly noted. Cardiac exam with regular rhythm normal S1 and S2. No murmur or gallop noted. Lungs are clear to auscultation. Abdomen is flat with colostomy at LLQ. Colostomy bag is soft brownish stool. Abdomen is soft and nontender without organomegaly or masses.  No LE edema or clubbing noted.    Assessment:  #1. History of colorectal cancer. He is status post APR in 2007. Last colonoscopy was in August 2014 with removal of small tubular adenoma. He is having intermittent discomfort with bowel movement. He does not have rectal bleeding. This symptom may be due to constipation. If he does not respond to colace will proceed with colonoscopy. #2. Dysphagia primarily to solids. Given history of GERD he may have Schatzki's ring or stricture. Given his age he may have esophageal motility disorder. #3. Paresthesias involving both distal legs and feet due to peripheral neuropathy   Plan:  Colace 100 mg by mouth twice a day. Barium pill esophagogram. Follow-up with Dr. Woody Seller regarding lower extremity symptoms. If abdominal pain does not improve with Colace will proceed with colonoscopy.

## 2016-03-27 ENCOUNTER — Ambulatory Visit (HOSPITAL_COMMUNITY)
Admission: RE | Admit: 2016-03-27 | Discharge: 2016-03-27 | Disposition: A | Discharge status: Home / Self Care | Payer: Medicare Other | Source: Ambulatory Visit | Attending: Internal Medicine | Admitting: Internal Medicine

## 2016-03-27 DIAGNOSIS — R1310 Dysphagia, unspecified: Secondary | ICD-10-CM | POA: Insufficient documentation

## 2016-03-27 DIAGNOSIS — R1319 Other dysphagia: Secondary | ICD-10-CM

## 2016-04-05 DIAGNOSIS — I10 Essential (primary) hypertension: Secondary | ICD-10-CM | POA: Diagnosis not present

## 2016-04-05 DIAGNOSIS — E7800 Pure hypercholesterolemia, unspecified: Secondary | ICD-10-CM | POA: Diagnosis not present

## 2016-04-11 ENCOUNTER — Encounter (INDEPENDENT_AMBULATORY_CARE_PROVIDER_SITE_OTHER): Payer: Self-pay | Admitting: Internal Medicine

## 2016-04-11 NOTE — Progress Notes (Signed)
Patient was given an appointment with Dr. Laural Golden for 07/16/16 at 11:00am.  A letter was mailed to the patient.

## 2016-04-18 ENCOUNTER — Ambulatory Visit (INDEPENDENT_AMBULATORY_CARE_PROVIDER_SITE_OTHER): Payer: Medicare Other | Admitting: Cardiology

## 2016-04-18 ENCOUNTER — Encounter: Payer: Self-pay | Admitting: Cardiology

## 2016-04-18 VITALS — BP 158/76 | HR 69 | Ht 70.0 in | Wt 157.6 lb

## 2016-04-18 DIAGNOSIS — I10 Essential (primary) hypertension: Secondary | ICD-10-CM

## 2016-04-18 DIAGNOSIS — K219 Gastro-esophageal reflux disease without esophagitis: Secondary | ICD-10-CM

## 2016-04-18 DIAGNOSIS — E782 Mixed hyperlipidemia: Secondary | ICD-10-CM

## 2016-04-18 DIAGNOSIS — I208 Other forms of angina pectoris: Secondary | ICD-10-CM | POA: Diagnosis not present

## 2016-04-18 DIAGNOSIS — I209 Angina pectoris, unspecified: Secondary | ICD-10-CM | POA: Diagnosis not present

## 2016-04-18 NOTE — Progress Notes (Signed)
.    Cardiology Office Note  Date: 04/18/2016   ID: Joshua Wilkerson, DOB December 15, 1927, MRN [id number]  PCP: Joshua Chroman, MD  Primary Cardiologist: Joshua Lesches, MD   Chief Complaint  Patient presents with  . Cardiac follow-up    History of Present Illness: Joshua Wilkerson is an 83 y.o. male last seen in July. He presents for a routine follow-up visit. Reports no progressive angina symptoms. States he has to rest sometimes with activities, but has had no worsening limitations. He reports some chest fullness that gets better when he belches.  Stress testing from last year is outlined below, overall low risk findings.  I reviewed his ECG today which shows sinus rhythm with borderline prolonged PR interval.  He states that he has cramping in his legs in the evenings, no definite claudication symptoms when he walks however.  I reviewed his medications which are outlined below in stable from a cardiac perspective. He is due for a physical with Dr. Woody Wilkerson soon.  Past Medical History:  Diagnosis Date  . BPH (benign prostatic hyperplasia)   . Colon cancer ([place])   . Esophageal reflux   . Essential hypertension, benign   . Inguinal hernia   . Prostate cancer [facility])     Past Surgical History:  Procedure Laterality Date  . COLON SURGERY  2007   [facility]  . COLONOSCOPY N/A 11/26/2012   Procedure: COLONOSCOPY;  Surgeon: Joshua Houston, MD;  [facility];  Service: Endoscopy;  Laterality: N/A;  . HERNIA REPAIR  [id number]   Umbilical  . HERNIA REPAIR  2006   Inguinal  . PROSTATE SURGERY  1989   [facility]    Current Outpatient Prescriptions  Medication Sig Dispense Refill  . acetaminophen (TYLENOL) 325 MG tablet Take 325 mg by mouth every 6 (six) hours as needed for pain.     Marland Kitchen amLODipine (NORVASC) 5 MG tablet Take 5 mg by mouth daily.    Marland Kitchen atorvastatin (LIPITOR) 10 MG tablet Take 10 mg by mouth daily.     Marland Kitchen docusate sodium (COLACE) 100 MG capsule Take 2 capsules (200 mg total)  by mouth at bedtime. 10 capsule 0  . donepezil (ARICEPT) 5 MG tablet Take 5 mg by mouth at bedtime.     . Multiple Vitamins-Minerals (CENTRUM SILVER PO) Take 1 tablet by mouth daily.    Marland Kitchen omeprazole (PRILOSEC) 20 MG capsule Take 20 mg by mouth daily.     No current facility-administered medications for this visit.    Allergies:  Diclofenac and Penicillins   Social History: The patient  reports that he quit smoking about 63 years ago. His smoking use included Cigarettes. He started smoking about 65 years ago. He has a 1.50 pack-year smoking history. He has never used smokeless tobacco. He reports that he does not drink alcohol or use drugs.   ROS:  Please see the history of present illness. Otherwise, complete review of systems is positive for hearing loss.  All other systems are reviewed and negative.   Physical Exam: VS:  BP (!) 158/76   Pulse 69   Ht 5\' 10"  (1.778 m)   Wt 157 lb 9.6 oz (71.5 kg)   SpO2 97%   BMI 22.61 kg/m , BMI Body mass index is 22.61 kg/m.  Wt Readings from Last 3 Encounters:  04/18/16 157 lb 9.6 oz (71.5 kg)  03/19/16 156 lb 1.6 oz (70.8 kg)  10/19/15 158 lb 9.6 oz (71.9 kg)  Normally nourished appearing male, comfortable at rest. HEENT: Conjunctiva and lids normal, oropharynx clear. Neck: Supple, no elevated JVP or carotid bruits, no thyromegaly. Lungs: Clear to auscultation, nonlabored breathing at rest. Cardiac: Regular rate and rhythm, no S3 or significant systolic murmur, no pericardial rub. Abdomen: Soft, nontender, bowel sounds present, no guarding or rebound. Extremities: No leg edema, distal pulses 2+. Skin: Warm and dry. Musculoskeletal: No kyphosis. Neuropsychiatric: Alert and oriented 3, affect appropriate.  ECG: I personally reviewed the tracing from 11/29/2014 which showed sinus rhythm with inferior Q wave.  Other Studies Reviewed Today:  Exercise Cardiolite 12/12/2014:  There was no ST segment deviation noted during stress.  The  study is normal. There are no defects to suggest infarct or ischemia  This is a low risk study.  The left ventricular ejection fraction is hyperdynamic (>65%).  Echocardiogram [facility]) April 2014: Mild LVH with LVEF 0000000, diastolic dysfunction, trace mitral regurgitation, mild aortic regurgitation, mild tricuspid regurgitation.  Assessment and Plan:  1. History of exertional angina, fewer symptoms than reported in the past. Cardiolite study from last year was low risk and we will plan to continue with observation for now. He is on Norvasc and Lipitor.  2. Essential hypertension, keep follow-up with Dr. Woody Wilkerson. No changes made to current regimen.  3. GERD, on Prilosec. Some of his chest discomfort is related to this as well.  4. Hyperlipidemia, on Lipitor. Due for follow-up lab work with Dr. Woody Wilkerson soon.  Current medicines were reviewed with the patient today.   Orders Placed This Encounter  Procedures  . EKG 12-Lead    Disposition: Follow-up in 6 months.  Signed, Joshua Sark, MD, [facility] 04/18/2016 1:23 PM    [place] [facility] [place], [place], [place] [address] Phone: [phone]; Fax: [phone]

## 2016-04-18 NOTE — Patient Instructions (Signed)

## 2016-05-10 DIAGNOSIS — Z1211 Encounter for screening for malignant neoplasm of colon: Secondary | ICD-10-CM | POA: Diagnosis not present

## 2016-05-10 DIAGNOSIS — Z299 Encounter for prophylactic measures, unspecified: Secondary | ICD-10-CM | POA: Diagnosis not present

## 2016-05-10 DIAGNOSIS — Z713 Dietary counseling and surveillance: Secondary | ICD-10-CM | POA: Diagnosis not present

## 2016-05-10 DIAGNOSIS — Z125 Encounter for screening for malignant neoplasm of prostate: Secondary | ICD-10-CM | POA: Diagnosis not present

## 2016-05-10 DIAGNOSIS — I10 Essential (primary) hypertension: Secondary | ICD-10-CM | POA: Diagnosis not present

## 2016-05-10 DIAGNOSIS — Z1389 Encounter for screening for other disorder: Secondary | ICD-10-CM | POA: Diagnosis not present

## 2016-05-10 DIAGNOSIS — Z0000 Encounter for general adult medical examination without abnormal findings: Secondary | ICD-10-CM | POA: Diagnosis not present

## 2016-05-10 DIAGNOSIS — Z7189 Other specified counseling: Secondary | ICD-10-CM | POA: Diagnosis not present

## 2016-05-10 DIAGNOSIS — Z79899 Other long term (current) drug therapy: Secondary | ICD-10-CM | POA: Diagnosis not present

## 2016-05-10 DIAGNOSIS — K219 Gastro-esophageal reflux disease without esophagitis: Secondary | ICD-10-CM | POA: Diagnosis not present

## 2016-05-10 DIAGNOSIS — R5383 Other fatigue: Secondary | ICD-10-CM | POA: Diagnosis not present

## 2016-05-10 DIAGNOSIS — E7800 Pure hypercholesterolemia, unspecified: Secondary | ICD-10-CM | POA: Diagnosis not present

## 2016-05-10 DIAGNOSIS — J069 Acute upper respiratory infection, unspecified: Secondary | ICD-10-CM | POA: Diagnosis not present

## 2016-05-13 DIAGNOSIS — I10 Essential (primary) hypertension: Secondary | ICD-10-CM | POA: Diagnosis not present

## 2016-05-13 DIAGNOSIS — E7800 Pure hypercholesterolemia, unspecified: Secondary | ICD-10-CM | POA: Diagnosis not present

## 2016-05-16 DIAGNOSIS — Z789 Other specified health status: Secondary | ICD-10-CM | POA: Diagnosis not present

## 2016-05-16 DIAGNOSIS — Z713 Dietary counseling and surveillance: Secondary | ICD-10-CM | POA: Diagnosis not present

## 2016-05-16 DIAGNOSIS — Z299 Encounter for prophylactic measures, unspecified: Secondary | ICD-10-CM | POA: Diagnosis not present

## 2016-05-16 DIAGNOSIS — Z6826 Body mass index (BMI) 26.0-26.9, adult: Secondary | ICD-10-CM | POA: Diagnosis not present

## 2016-05-16 DIAGNOSIS — R42 Dizziness and giddiness: Secondary | ICD-10-CM | POA: Diagnosis not present

## 2016-05-28 DIAGNOSIS — I10 Essential (primary) hypertension: Secondary | ICD-10-CM | POA: Diagnosis not present

## 2016-05-28 DIAGNOSIS — E7800 Pure hypercholesterolemia, unspecified: Secondary | ICD-10-CM | POA: Diagnosis not present

## 2016-07-16 ENCOUNTER — Encounter (INDEPENDENT_AMBULATORY_CARE_PROVIDER_SITE_OTHER): Payer: Self-pay | Admitting: Internal Medicine

## 2016-07-16 ENCOUNTER — Ambulatory Visit (INDEPENDENT_AMBULATORY_CARE_PROVIDER_SITE_OTHER): Payer: Medicare Other | Admitting: Internal Medicine

## 2016-07-16 ENCOUNTER — Encounter (INDEPENDENT_AMBULATORY_CARE_PROVIDER_SITE_OTHER): Payer: Self-pay

## 2016-07-16 VITALS — BP 130/76 | HR 66 | Temp 98.0°F | Resp 18 | Ht 70.0 in | Wt 152.8 lb

## 2016-07-16 DIAGNOSIS — I208 Other forms of angina pectoris: Secondary | ICD-10-CM

## 2016-07-16 DIAGNOSIS — K219 Gastro-esophageal reflux disease without esophagitis: Secondary | ICD-10-CM

## 2016-07-16 DIAGNOSIS — R1310 Dysphagia, unspecified: Secondary | ICD-10-CM

## 2016-07-16 DIAGNOSIS — Z85038 Personal history of other malignant neoplasm of large intestine: Secondary | ICD-10-CM

## 2016-07-16 DIAGNOSIS — R1319 Other dysphagia: Secondary | ICD-10-CM

## 2016-07-16 NOTE — Patient Instructions (Signed)
Can try decreasing omeprazole dose to every other day. He can go back to daily schedule if every other day results in breakthrough symptoms or heartburn. Call if swallowing difficulty gets worse.

## 2016-07-16 NOTE — Progress Notes (Signed)
Presenting complaint;  Follow-up for dysphagia  Database and Subjective:  Patient is 83 year old African-American male who was last seen in December 2017 for dysphagia. Barium pill esophagogram was obtained and it revealed impaired esophageal motility. Barium pill passed from oral cavity to stomach without any delay. Results were reviewed with patient over the phone and he was advised to monitor his symptoms, chew food thoroughly and eats slowly. He was advised to refrain from eating grilled meats. He was also advised to use Colace daily rather than when necessary.  He now returns for follow-up visit. He states he is having less difficulty swallowing. He eats slowly and chews food thoroughly. He states PPIs working. Bowels move daily. He denies melena or rectal bleeding. He has lost 4 pounds since his last visit. He believes he lost few pounds when he had cold over a month ago. He has fully recovered. He has not experienced any episodes of food impaction.    Current Medications: Outpatient Encounter Prescriptions as of 07/16/2016  Medication Sig  . acetaminophen (TYLENOL) 325 MG tablet Take 325 mg by mouth every 6 (six) hours as needed for pain.   Marland Kitchen amLODipine (NORVASC) 5 MG tablet Take 5 mg by mouth daily.  Marland Kitchen atorvastatin (LIPITOR) 10 MG tablet Take 10 mg by mouth. Patient takes 1 every night after supper.  . docusate sodium (COLACE) 100 MG capsule Take 2 capsules (200 mg total) by mouth at bedtime.  . donepezil (ARICEPT) 5 MG tablet Take 5 mg by mouth at bedtime.   . Ferrous Gluconate 324 (37.5 Fe) MG TABS Take 324 mg by mouth daily.  . Multiple Vitamins-Minerals (CENTRUM SILVER PO) Take 1 tablet by mouth daily.  Marland Kitchen omeprazole (PRILOSEC) 20 MG capsule Take 20 mg by mouth daily.   No facility-administered encounter medications on file as of 07/16/2016.      Objective: Blood pressure 130/76, pulse 66, temperature 98 F (36.7 C), temperature source Oral, resp. rate 18, height 5\' 10"   (1.778 m), weight 152 lb 12.8 oz (69.3 kg).  Patient is alert and in no acute distress. Conjunctiva is pink. Sclera is nonicteric Oropharyngeal mucosa is normal. He has partial upper and lower dental plates. No neck masses or thyromegaly noted. Cardiac exam with regular rhythm normal S1 and S2. No murmur or gallop noted. Lungs are clear to auscultation. Abdomen;  No LE edema or clubbing noted.  Labs/studies Results:  Barium pill esophagogram on 03/27/2016 No esophageal stricture noted. Barium pill passed from oral cavity to stomach without any delay. Diffuse impairment of esophageal motility noted with incomplete clearance of barium by primary peristalsis. Secondary survey is also noted.  Assessment:  #1. Dysphagia secondary to esophageal motility disorder. He is having less symptoms as he is eating slowly and not eating dry meats. Will continue to monitor his dysphagia. If symptoms worse may consider esophageal dilation. #2. GERD. He is doing well with daily dose of PPI. He may want to try every other day. #3. History of colon carcinoma. Last colonoscopy was in August 2014. #4.  Plan:  Try omeprazole every other day. Can go back to daily schedule if frequent heartburn experienced. Patient will call if dysphagia worsens. Office visit in one year.

## 2016-08-09 DIAGNOSIS — N400 Enlarged prostate without lower urinary tract symptoms: Secondary | ICD-10-CM | POA: Diagnosis not present

## 2016-08-09 DIAGNOSIS — M545 Low back pain: Secondary | ICD-10-CM | POA: Diagnosis not present

## 2016-08-09 DIAGNOSIS — R21 Rash and other nonspecific skin eruption: Secondary | ICD-10-CM | POA: Diagnosis not present

## 2016-08-09 DIAGNOSIS — Z6823 Body mass index (BMI) 23.0-23.9, adult: Secondary | ICD-10-CM | POA: Diagnosis not present

## 2016-08-09 DIAGNOSIS — Z299 Encounter for prophylactic measures, unspecified: Secondary | ICD-10-CM | POA: Diagnosis not present

## 2016-08-09 DIAGNOSIS — F419 Anxiety disorder, unspecified: Secondary | ICD-10-CM | POA: Diagnosis not present

## 2016-08-29 DIAGNOSIS — E7800 Pure hypercholesterolemia, unspecified: Secondary | ICD-10-CM | POA: Diagnosis not present

## 2016-08-29 DIAGNOSIS — I10 Essential (primary) hypertension: Secondary | ICD-10-CM | POA: Diagnosis not present

## 2016-10-15 NOTE — Progress Notes (Signed)
Cardiology Office Note  Date: 10/17/2016   ID: Joshua Wilkerson, DOB 1927/09/07, MRN [id number]  PCP: Glenda Chroman, MD  Primary Cardiologist: Rozann Lesches, MD   Chief Complaint  Patient presents with  . Cardiac follow-up    History of Present Illness: Joshua Wilkerson is an 83 y.o. male last seen in January. He presents for a routine follow-up visit. Since last encounter he does not report any progressive exertional angina symptoms. Has occasional reflux.  I reviewed his medications. He continues on Norvasc and Lipitor. He is following with Dr. Woody Seller for primary care.  Cardiolite study from 2016 is reviewed below. We have continued with medical therapy and observation.  Past Medical History:  Diagnosis Date  . BPH (benign prostatic hyperplasia)   . Colon cancer ([place])   . Esophageal reflux   . Essential hypertension, benign   . Inguinal hernia   . Prostate cancer [facility])     Past Surgical History:  Procedure Laterality Date  . COLON SURGERY  2007   [facility]  . COLONOSCOPY N/A 11/26/2012   Procedure: COLONOSCOPY;  Surgeon: Rogene Houston, MD;  [facility];  Service: Endoscopy;  Laterality: N/A;  . HERNIA REPAIR  [id number]   Umbilical  . HERNIA REPAIR  2006   Inguinal  . PROSTATE SURGERY  1989   [facility]    Current Outpatient Prescriptions  Medication Sig Dispense Refill  . acetaminophen (TYLENOL) 325 MG tablet Take 325 mg by mouth every 6 (six) hours as needed for pain.     Marland Kitchen amLODipine (NORVASC) 5 MG tablet Take 5 mg by mouth daily.    Marland Kitchen atorvastatin (LIPITOR) 10 MG tablet Take 10 mg by mouth. Patient takes 1 every night after supper.    . docusate sodium (COLACE) 100 MG capsule Take 2 capsules (200 mg total) by mouth at bedtime. 10 capsule 0  . donepezil (ARICEPT) 5 MG tablet Take 5 mg by mouth at bedtime.     . Ferrous Gluconate 324 (37.5 Fe) MG TABS Take 324 mg by mouth daily.    . Multiple Vitamins-Minerals (CENTRUM SILVER PO) Take 1 tablet by mouth daily.     Marland Kitchen omeprazole (PRILOSEC) 20 MG capsule Take 20 mg by mouth daily.     No current facility-administered medications for this visit.    Allergies:  Diclofenac and Penicillins   Social History: The patient  reports that he quit smoking about 63 years ago. His smoking use included Cigarettes. He started smoking about 65 years ago. He has a 1.50 pack-year smoking history. He has never used smokeless tobacco. He reports that he does not drink alcohol or use drugs.   ROS:  Please see the history of present illness. Otherwise, complete review of systems is positive for hearing loss, occasional leg cramps in the morning.  All other systems are reviewed and negative.   Physical Exam: VS:  BP 130/72   Pulse 73   Ht 5\' 10"  (1.778 m)   Wt 151 lb (68.5 kg)   SpO2 97%   BMI 21.67 kg/m , BMI Body mass index is 21.67 kg/m.  Wt Readings from Last 3 Encounters:  10/17/16 151 lb (68.5 kg)  07/16/16 152 lb 12.8 oz (69.3 kg)  04/18/16 157 lb 9.6 oz (71.5 kg)    Elderly male, comfortable at rest. HEENT: Conjunctiva and lids normal, oropharynx clear. Neck: Supple, no elevated JVP or carotid bruits, no thyromegaly. Lungs: Clear to auscultation, nonlabored breathing at rest. Cardiac: Regular  rate and rhythm, no S3 or significant systolic murmur, no pericardial rub. Abdomen: Soft, nontender, bowel sounds present, no guarding or rebound. Extremities: No leg edema, distal pulses 2+. Skin: Warm and dry. Musculoskeletal: No kyphosis. Neuropsychiatric: Alert and oriented 3, affect appropriate.  ECG: I personally reviewed the tracing from 04/18/2016 which showed normal sinus rhythm.  Other Studies Reviewed Today:  Exercise Cardiolite 12/12/2014:  There was no ST segment deviation noted during stress.  The study is normal. There are no defects to suggest infarct or ischemia  This is a low risk study.  The left ventricular ejection fraction is hyperdynamic (>65%).  Echocardiogram [facility]) April 2014: Mild LVH with LVEF 61-90%, diastolic dysfunction, trace mitral regurgitation, mild aortic regurgitation, mild tricuspid regurgitation.  Assessment and Plan:  1. Exertional angina, infrequent at this point on Norvasc and Lipitor. We are continuing with conservative management, low risk Cardiolite noted in 2016.  2. Essential hypertension, systolic blood pressure to 130s today. Continue Norvasc.  3. Hyperlipidemia on Lipitor. He is following with Dr. Woody Seller.  4. GERD on Prilosec. Reports intermittent reflux.  Current medicines were reviewed with the patient today.  Disposition: Follow-up in 6 months.  Signed, Satira Sark, MD, [facility] 10/17/2016 10:56 AM    [facility] at [place], [place], [place] [address] Phone: [phone]; Fax: [phone]

## 2016-10-17 ENCOUNTER — Encounter: Payer: Self-pay | Admitting: Cardiology

## 2016-10-17 ENCOUNTER — Ambulatory Visit (INDEPENDENT_AMBULATORY_CARE_PROVIDER_SITE_OTHER): Payer: Medicare Other | Admitting: Cardiology

## 2016-10-17 VITALS — BP 130/72 | HR 73 | Ht 70.0 in | Wt 151.0 lb

## 2016-10-17 DIAGNOSIS — I209 Angina pectoris, unspecified: Secondary | ICD-10-CM | POA: Diagnosis not present

## 2016-10-17 DIAGNOSIS — I10 Essential (primary) hypertension: Secondary | ICD-10-CM

## 2016-10-17 DIAGNOSIS — K219 Gastro-esophageal reflux disease without esophagitis: Secondary | ICD-10-CM

## 2016-10-17 DIAGNOSIS — E782 Mixed hyperlipidemia: Secondary | ICD-10-CM | POA: Diagnosis not present

## 2016-10-17 DIAGNOSIS — I208 Other forms of angina pectoris: Secondary | ICD-10-CM | POA: Diagnosis not present

## 2016-10-17 NOTE — Patient Instructions (Signed)

## 2016-11-18 DIAGNOSIS — H5203 Hypermetropia, bilateral: Secondary | ICD-10-CM | POA: Diagnosis not present

## 2016-11-18 DIAGNOSIS — H25813 Combined forms of age-related cataract, bilateral: Secondary | ICD-10-CM | POA: Diagnosis not present

## 2016-11-18 DIAGNOSIS — H52223 Regular astigmatism, bilateral: Secondary | ICD-10-CM | POA: Diagnosis not present

## 2016-11-18 DIAGNOSIS — H04123 Dry eye syndrome of bilateral lacrimal glands: Secondary | ICD-10-CM | POA: Diagnosis not present

## 2016-11-18 DIAGNOSIS — H524 Presbyopia: Secondary | ICD-10-CM | POA: Diagnosis not present

## 2016-12-06 DIAGNOSIS — E7800 Pure hypercholesterolemia, unspecified: Secondary | ICD-10-CM | POA: Diagnosis not present

## 2016-12-06 DIAGNOSIS — N400 Enlarged prostate without lower urinary tract symptoms: Secondary | ICD-10-CM | POA: Diagnosis not present

## 2016-12-06 DIAGNOSIS — Z299 Encounter for prophylactic measures, unspecified: Secondary | ICD-10-CM | POA: Diagnosis not present

## 2016-12-06 DIAGNOSIS — I10 Essential (primary) hypertension: Secondary | ICD-10-CM | POA: Diagnosis not present

## 2016-12-06 DIAGNOSIS — F0390 Unspecified dementia without behavioral disturbance: Secondary | ICD-10-CM | POA: Diagnosis not present

## 2016-12-06 DIAGNOSIS — J069 Acute upper respiratory infection, unspecified: Secondary | ICD-10-CM | POA: Diagnosis not present

## 2016-12-06 DIAGNOSIS — F419 Anxiety disorder, unspecified: Secondary | ICD-10-CM | POA: Diagnosis not present

## 2016-12-06 DIAGNOSIS — Z6823 Body mass index (BMI) 23.0-23.9, adult: Secondary | ICD-10-CM | POA: Diagnosis not present

## 2016-12-06 DIAGNOSIS — F515 Nightmare disorder: Secondary | ICD-10-CM | POA: Diagnosis not present

## 2017-01-16 DIAGNOSIS — Z23 Encounter for immunization: Secondary | ICD-10-CM | POA: Diagnosis not present

## 2017-05-16 DIAGNOSIS — N400 Enlarged prostate without lower urinary tract symptoms: Secondary | ICD-10-CM | POA: Diagnosis not present

## 2017-05-16 DIAGNOSIS — Z0000 Encounter for general adult medical examination without abnormal findings: Secondary | ICD-10-CM | POA: Diagnosis not present

## 2017-05-16 DIAGNOSIS — Z1339 Encounter for screening examination for other mental health and behavioral disorders: Secondary | ICD-10-CM | POA: Diagnosis not present

## 2017-05-16 DIAGNOSIS — Z125 Encounter for screening for malignant neoplasm of prostate: Secondary | ICD-10-CM | POA: Diagnosis not present

## 2017-05-16 DIAGNOSIS — Z1211 Encounter for screening for malignant neoplasm of colon: Secondary | ICD-10-CM | POA: Diagnosis not present

## 2017-05-16 DIAGNOSIS — Z1331 Encounter for screening for depression: Secondary | ICD-10-CM | POA: Diagnosis not present

## 2017-05-16 DIAGNOSIS — R5383 Other fatigue: Secondary | ICD-10-CM | POA: Diagnosis not present

## 2017-05-16 DIAGNOSIS — Z79899 Other long term (current) drug therapy: Secondary | ICD-10-CM | POA: Diagnosis not present

## 2017-05-16 DIAGNOSIS — K219 Gastro-esophageal reflux disease without esophagitis: Secondary | ICD-10-CM | POA: Diagnosis not present

## 2017-05-16 DIAGNOSIS — Z7189 Other specified counseling: Secondary | ICD-10-CM | POA: Diagnosis not present

## 2017-05-16 DIAGNOSIS — Z6823 Body mass index (BMI) 23.0-23.9, adult: Secondary | ICD-10-CM | POA: Diagnosis not present

## 2017-05-16 DIAGNOSIS — F0390 Unspecified dementia without behavioral disturbance: Secondary | ICD-10-CM | POA: Diagnosis not present

## 2017-05-16 DIAGNOSIS — Z299 Encounter for prophylactic measures, unspecified: Secondary | ICD-10-CM | POA: Diagnosis not present

## 2017-05-16 DIAGNOSIS — I10 Essential (primary) hypertension: Secondary | ICD-10-CM | POA: Diagnosis not present

## 2017-07-15 DIAGNOSIS — I10 Essential (primary) hypertension: Secondary | ICD-10-CM | POA: Diagnosis not present

## 2017-07-15 DIAGNOSIS — E7800 Pure hypercholesterolemia, unspecified: Secondary | ICD-10-CM | POA: Diagnosis not present

## 2017-07-29 ENCOUNTER — Ambulatory Visit (INDEPENDENT_AMBULATORY_CARE_PROVIDER_SITE_OTHER): Payer: Medicare Other | Admitting: Internal Medicine

## 2017-08-03 IMAGING — RF DG ESOPHAGUS
12 of 13 series · 14 of 24 positions shown · non-contrast
Comparison: None

CLINICAL DATA: Esophageal dysphagia

EXAM:
ESOPHOGRAM / BARIUM SWALLOW / BARIUM TABLET STUDY
TECHNIQUE: Combined double contrast and single contrast examination performed
using effervescent crystals, thick barium liquid, and thin barium
liquid. The patient was observed with fluoroscopy swallowing a 13 mm
barium sulphate tablet.
FLUOROSCOPY TIME:  Fluoroscopy Time:  1 minutes 18 seconds
Radiation Exposure Index (if provided by the fluoroscopic device):
11.9 mGy
Number of Acquired Spot Images: Multiple screen captures during
fluoroscopy

[Series 1: cp_standard · 0.19mm/px · 1 of 16 frames shown (1 of 12)]
[frame 3/16]
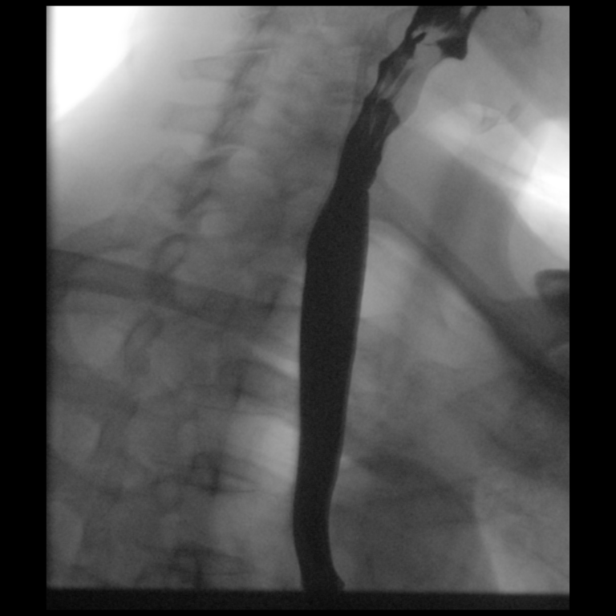

[Series 2: cp_standard · 0.19mm/px · 1 of 22 frames shown (2 of 12)]
[frame 4/22]
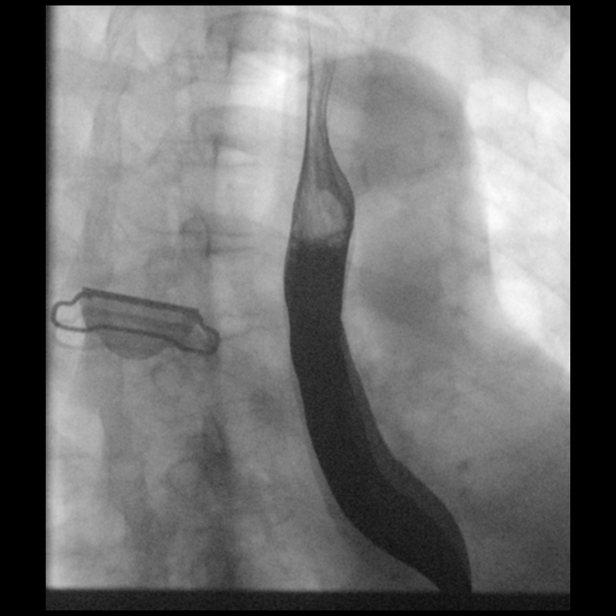

[Series 3: cp_standard · 0.19mm/px · 1 of 110 frames shown (3 of 12)]
[frame 56/110]
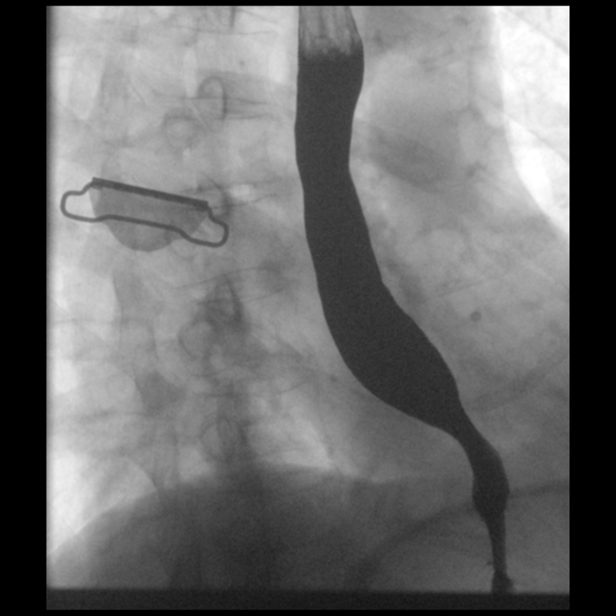

[Series 4: cp_standard · 0.19mm/px · 1 of 36 frames shown (4 of 12)]
[frame 25/36]
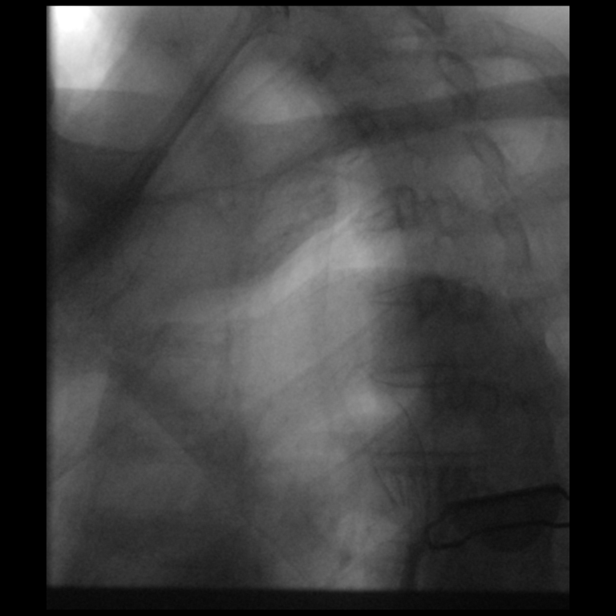

[Series 5: cp_standard · 0.19mm/px · 1 of 40 frames shown (5 of 12)]
[frame 7/40]
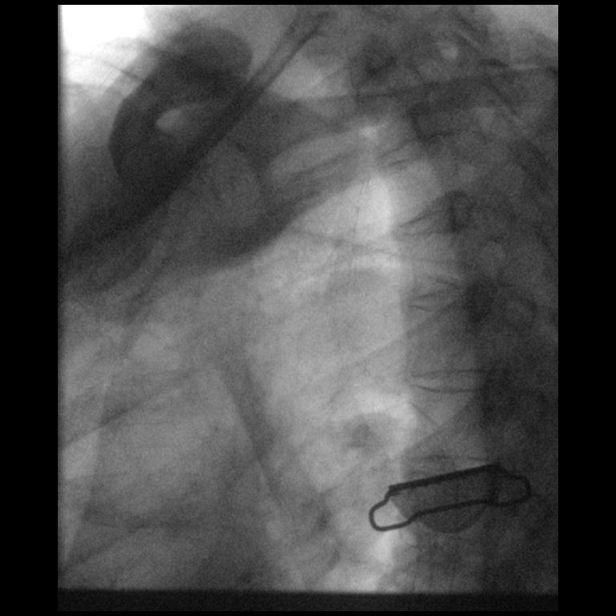

[Series 6: cp_standard · 0.19mm/px · 1 of 46 frames shown (6 of 12)]
[frame 1/46]
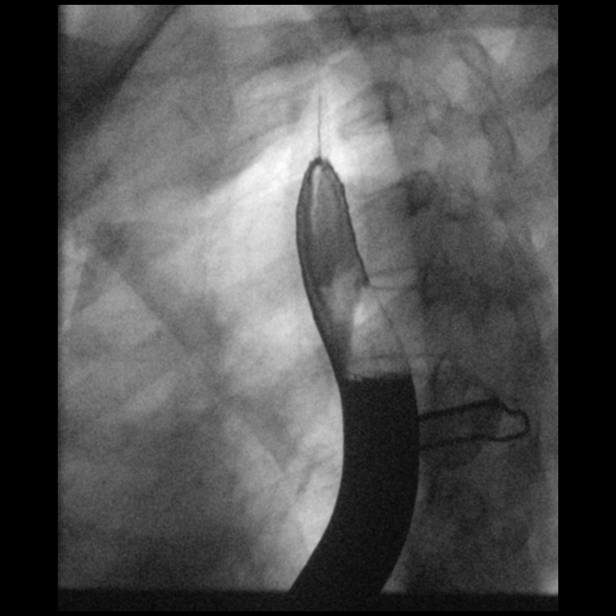

[Series 7: cp_standard · 0.20mm/px · 2 of 16 frames shown (7 of 12)]
[frame 2/16]
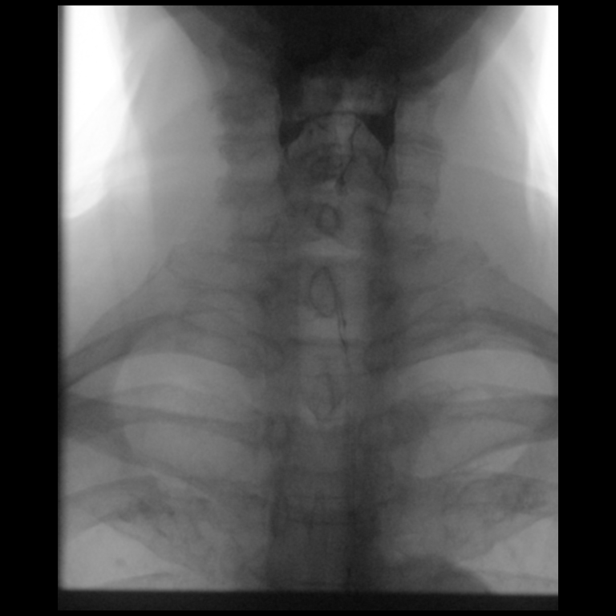
[frame 14/16]
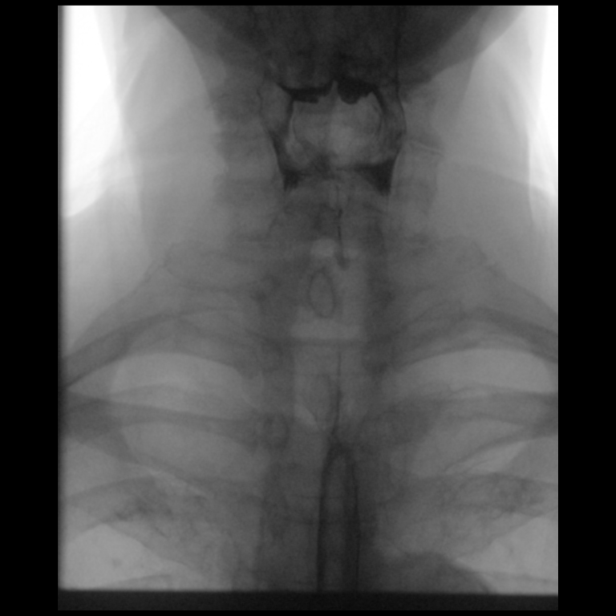

[Series 9: cp_standard · 0.18mm/px · 1 of 15 frames shown (8 of 12)]
[frame 8/15]
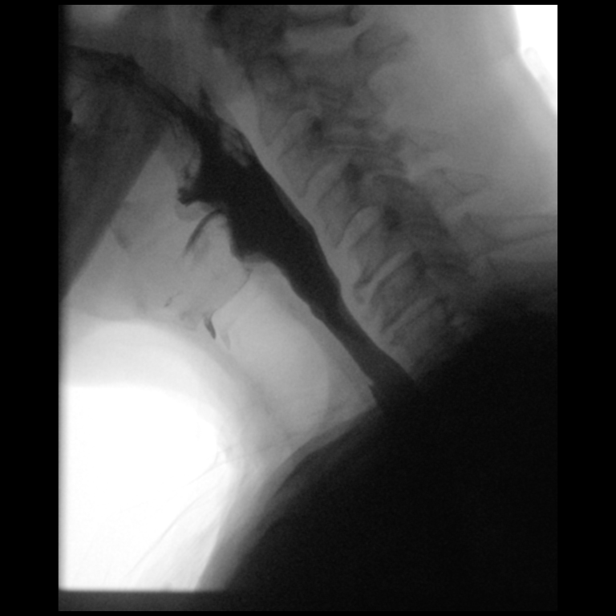

[Series 12: cp_standard · 0.19mm/px · 1 of 18 frames shown (9 of 12)]
[frame 1/18]
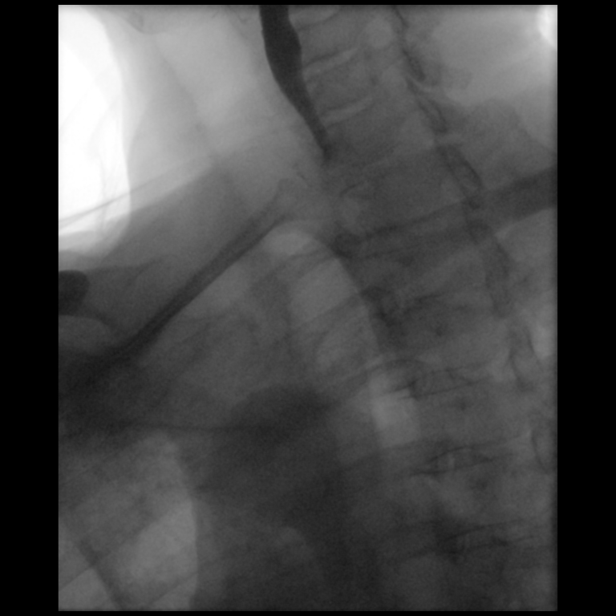

[Series 12: cp_standard · 0.19mm/px · 2 of 32 frames shown (10 of 12)]
[frame 2/32]
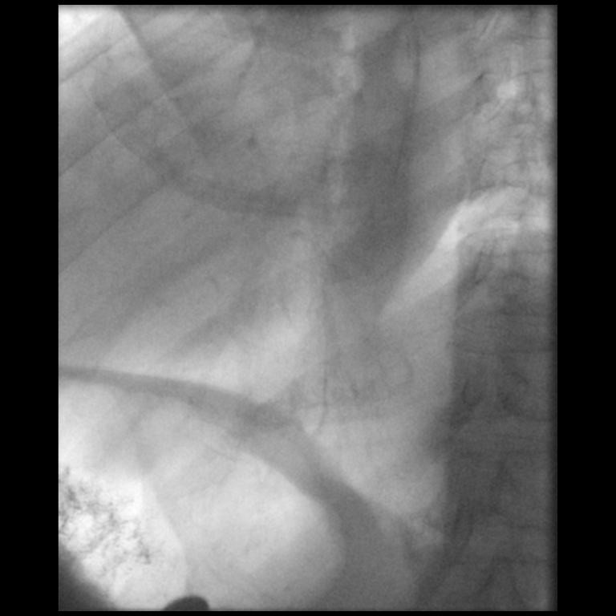
[frame 17/32]
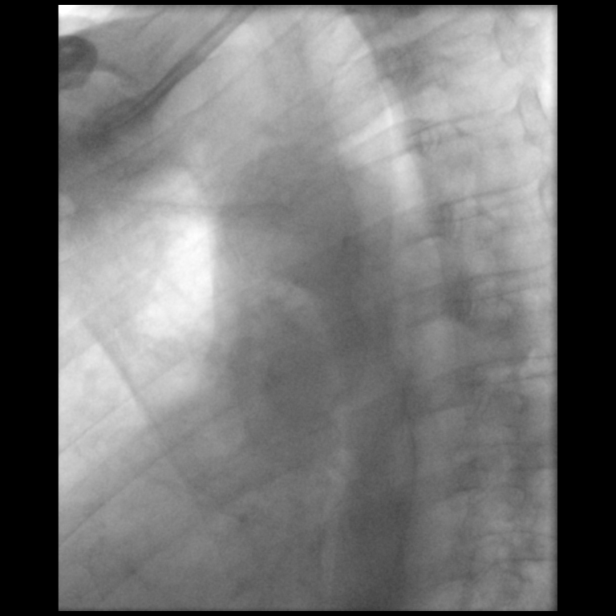

[Series 13: cp_standard · 0.19mm/px · 1 of 16 frames shown (11 of 12)]
[frame 15/16]
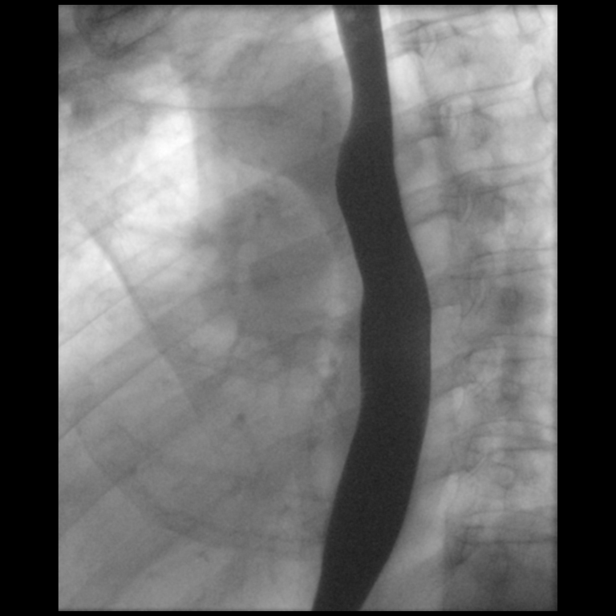

[Series 15: cp_standard · 0.19mm/px · 1 of 41 frames shown (12 of 12)]
[frame 35/41]
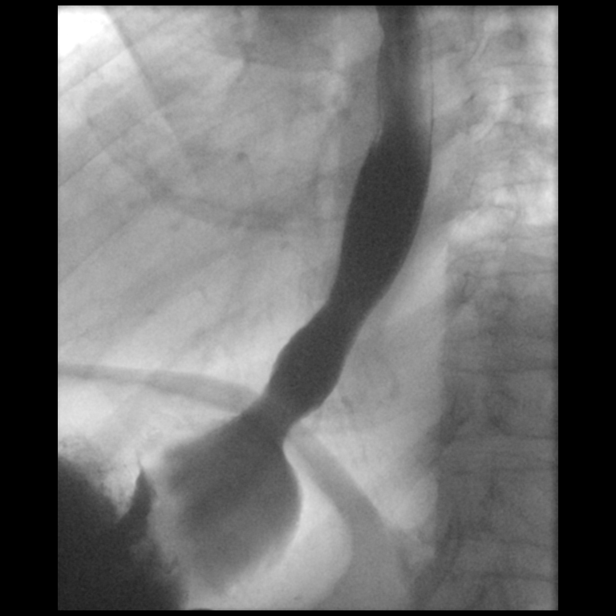

[14 of 24 positions shown; findings below may reference images not displayed]

FINDINGS: Normal esophageal distention.

No esophageal mass or stricture.

12.5 mm diameter barium tablet passed from oral cavity to stomach
without delay.

Diffuse age-related impairment of esophageal motility, mild in
degree, with incomplete clearance of barium by primary peristaltic
waves.

Scattered secondary waves noted.

No persistent intraluminal filling defects.

Targeted rapid sequence imaging of the cervical esophagus and
hypopharynx was performed.

Laryngeal penetration and aspiration of contrast into the proximal
trachea were identified.

No spontaneous cough reflex was seen.

Patient cleared contrast from the proximal trachea with voluntary
coughing.

Mild vallecular and piriform sinus residuals were identified.
IMPRESSION: Laryngeal penetration with aspiration of contrast into the proximal
trachea.

Absent spontaneous cough reflex, though contrast was cleared from
the proximal trachea after voluntary coughing.

Vallecular and piriform sinus residuals.

No evidence of esophageal mass or stricture.

## 2017-08-15 DIAGNOSIS — I10 Essential (primary) hypertension: Secondary | ICD-10-CM | POA: Diagnosis not present

## 2017-08-15 DIAGNOSIS — E7800 Pure hypercholesterolemia, unspecified: Secondary | ICD-10-CM | POA: Diagnosis not present

## 2017-08-15 DIAGNOSIS — R21 Rash and other nonspecific skin eruption: Secondary | ICD-10-CM | POA: Diagnosis not present

## 2017-08-15 DIAGNOSIS — F419 Anxiety disorder, unspecified: Secondary | ICD-10-CM | POA: Diagnosis not present

## 2017-08-15 DIAGNOSIS — R200 Anesthesia of skin: Secondary | ICD-10-CM | POA: Diagnosis not present

## 2017-08-15 DIAGNOSIS — Z6822 Body mass index (BMI) 22.0-22.9, adult: Secondary | ICD-10-CM | POA: Diagnosis not present

## 2017-08-15 DIAGNOSIS — Z299 Encounter for prophylactic measures, unspecified: Secondary | ICD-10-CM | POA: Diagnosis not present

## 2017-11-26 DIAGNOSIS — E7800 Pure hypercholesterolemia, unspecified: Secondary | ICD-10-CM | POA: Diagnosis not present

## 2017-11-26 DIAGNOSIS — I10 Essential (primary) hypertension: Secondary | ICD-10-CM | POA: Diagnosis not present

## 2017-12-24 DIAGNOSIS — Z6822 Body mass index (BMI) 22.0-22.9, adult: Secondary | ICD-10-CM | POA: Diagnosis not present

## 2017-12-24 DIAGNOSIS — J309 Allergic rhinitis, unspecified: Secondary | ICD-10-CM | POA: Diagnosis not present

## 2017-12-24 DIAGNOSIS — F0390 Unspecified dementia without behavioral disturbance: Secondary | ICD-10-CM | POA: Diagnosis not present

## 2017-12-24 DIAGNOSIS — Z299 Encounter for prophylactic measures, unspecified: Secondary | ICD-10-CM | POA: Diagnosis not present

## 2017-12-24 DIAGNOSIS — I10 Essential (primary) hypertension: Secondary | ICD-10-CM | POA: Diagnosis not present

## 2017-12-24 DIAGNOSIS — E7800 Pure hypercholesterolemia, unspecified: Secondary | ICD-10-CM | POA: Diagnosis not present

## 2018-01-08 DIAGNOSIS — Z23 Encounter for immunization: Secondary | ICD-10-CM | POA: Diagnosis not present

## 2018-01-12 DIAGNOSIS — E7800 Pure hypercholesterolemia, unspecified: Secondary | ICD-10-CM | POA: Diagnosis not present

## 2018-01-12 DIAGNOSIS — I10 Essential (primary) hypertension: Secondary | ICD-10-CM | POA: Diagnosis not present

## 2018-01-13 ENCOUNTER — Encounter: Payer: Self-pay | Admitting: *Deleted

## 2018-01-13 ENCOUNTER — Ambulatory Visit (INDEPENDENT_AMBULATORY_CARE_PROVIDER_SITE_OTHER): Payer: Medicare Other | Admitting: Cardiology

## 2018-01-13 ENCOUNTER — Encounter: Payer: Self-pay | Admitting: Cardiology

## 2018-01-13 VITALS — BP 128/70 | HR 67 | Ht 70.0 in | Wt 149.2 lb

## 2018-01-13 DIAGNOSIS — E782 Mixed hyperlipidemia: Secondary | ICD-10-CM

## 2018-01-13 DIAGNOSIS — I10 Essential (primary) hypertension: Secondary | ICD-10-CM

## 2018-01-13 DIAGNOSIS — K219 Gastro-esophageal reflux disease without esophagitis: Secondary | ICD-10-CM

## 2018-01-13 DIAGNOSIS — I208 Other forms of angina pectoris: Secondary | ICD-10-CM | POA: Diagnosis not present

## 2018-01-13 NOTE — Progress Notes (Signed)
Cardiology Office Note  Date: 01/13/2018   ID: [name], DOB 04-25-1927, MRN [id number]  PCP: Glenda Chroman, MD  Primary Cardiologist: Rozann Lesches, MD   Chief Complaint  Patient presents with  . Cardiac follow-up    History of Present Illness: Joshua Wilkerson is an 84 y.o. male last seen in July 2018.  He presents overdue for follow-up.  He does not report any progressive exertional chest pain since last encounter with typical ADLs.  He does have occasional reflux symptoms and musculoskeletal left shoulder discomfort with activity.  I reviewed his medications.  He continues on Lipitor and Norvasc.  I also recommended that he take a baby aspirin once daily.  He states that he follows regularly with Dr. Woody Seller.  I personally reviewed his ECG today which shows sinus rhythm with prolonged PR interval and right bundle branch block.  Past Medical History:  Diagnosis Date  . BPH (benign prostatic hyperplasia)   . Colon cancer ([place])   . Esophageal reflux   . Essential hypertension, benign   . Inguinal hernia   . Prostate cancer [facility])     Past Surgical History:  Procedure Laterality Date  . COLON SURGERY  2007   [facility]  . COLONOSCOPY N/A 11/26/2012   Procedure: COLONOSCOPY;  Surgeon: Rogene Houston, MD;  [facility];  Service: Endoscopy;  Laterality: N/A;  . HERNIA REPAIR  [id number]   Umbilical  . HERNIA REPAIR  2006   Inguinal  . PROSTATE SURGERY  1989   [facility]    Current Outpatient Medications  Medication Sig Dispense Refill  . acetaminophen (TYLENOL) 325 MG tablet Take 325 mg by mouth every 6 (six) hours as needed for pain.     Marland Kitchen amLODipine (NORVASC) 5 MG tablet Take 5 mg by mouth daily.    Marland Kitchen atorvastatin (LIPITOR) 10 MG tablet Take 10 mg by mouth. Patient takes 1 every night after supper.    . docusate sodium (COLACE) 100 MG capsule Take 2 capsules (200 mg total) by mouth at bedtime. 10 capsule 0  . donepezil (ARICEPT) 5 MG tablet Take 5 mg by mouth  at bedtime.     . Ferrous Gluconate 324 (37.5 Fe) MG TABS Take 324 mg by mouth daily.    . Multiple Vitamins-Minerals (CENTRUM SILVER PO) Take 1 tablet by mouth daily.    Marland Kitchen omeprazole (PRILOSEC) 20 MG capsule Take 20 mg by mouth daily.     No current facility-administered medications for this visit.    Allergies:  Diclofenac and Penicillins   Social History: The patient  reports that he quit smoking about 64 years ago. His smoking use included cigarettes. He started smoking about 66 years ago. He has a 1.50 pack-year smoking history. He has never used smokeless tobacco. He reports that he does not drink alcohol or use drugs.   ROS:  Please see the history of present illness. Otherwise, complete review of systems is positive for hearing and memory loss.  All other systems are reviewed and negative.   Physical Exam: VS:  BP 128/70   Pulse 67   Ht 5\' 10"  (1.778 m)   Wt 149 lb 3.2 oz (67.7 kg)   SpO2 97%   BMI 21.41 kg/m , BMI Body mass index is 21.41 kg/m.  Wt Readings from Last 3 Encounters:  01/13/18 149 lb 3.2 oz (67.7 kg)  10/17/16 151 lb (68.5 kg)  07/16/16 152 lb 12.8 oz (69.3 kg)    General:  Patient appears comfortable at rest. HEENT: Conjunctiva and lids normal, oropharynx clear. Neck: Supple, no elevated JVP, right carotid bruit, no thyromegaly. Lungs: Clear to auscultation, nonlabored breathing at rest. Cardiac: Regular rate and rhythm, no S3 or significant systolic murmur. Abdomen: Soft, nontender, bowel sounds present. Extremities: No pitting edema, distal pulses 2+. Skin: Warm and dry. Musculoskeletal: No kyphosis. Neuropsychiatric: Alert and oriented x3, affect grossly appropriate.  ECG: I personally reviewed the tracing from 04/18/2016 which showed normal sinus rhythm.  Other Studies Reviewed Today:  Exercise Cardiolite 12/12/2014:  There was no ST segment deviation noted during stress.  The study is normal. There are no defects to suggest infarct or  ischemia  This is a low risk study.  The left ventricular ejection fraction is hyperdynamic (>65%).  Echocardiogram [facility]) April 2014: Mild LVH with LVEF 47-65%, diastolic dysfunction, trace mitral regurgitation, mild aortic regurgitation, mild tricuspid regurgitation.  Assessment and Plan:  1.  History of exertional angina and potential underlying ischemic heart disease, although with low risk testing as of 2016.  We have continued with conservative medical therapy and he reports no progressive symptoms with stable ECG.  Recommending aspirin 81 mg daily along with statin therapy.  2.  Essential hypertension, continues to follow with Dr. Woody Seller.  Blood pressure is well controlled today.  3.  Mixed hyperlipidemia on Lipitor.  Requesting most recent lipid panel from Dr. Woody Seller.  4.  GERD on Prilosec.  Current medicines were reviewed with the patient today.   Orders Placed This Encounter  Procedures  . EKG 12-Lead    Disposition: Follow-up in 1 year.  Signed, Satira Sark, MD, [facility] 01/13/2018 2:42 PM    [facility] at [place], [place],  [address] Phone: [phone]; Fax: [phone]

## 2018-01-13 NOTE — Patient Instructions (Addendum)
Medication Instructions:   Your physician has recommended you make the following change in your medication:   Start aspirin 81 mg by mouth daily.  Continue all other medications the same.  Labwork:  NONE  Testing/Procedures:  NONE  Follow-Up:  Your physician recommends that you schedule a follow-up appointment in: 1 year. You will receive a reminder letter in the mail in about 10 months reminding you to call and schedule your appointment. If you don't receive this letter, please contact our office.  Any Other Special Instructions Will Be Listed Below (If Applicable).  If you need a refill on your cardiac medications before your next appointment, please call your pharmacy.

## 2018-01-15 ENCOUNTER — Ambulatory Visit: Payer: Medicare Other | Admitting: Cardiology

## 2018-01-27 ENCOUNTER — Ambulatory Visit: Payer: Medicare Other | Admitting: Cardiology

## 2018-03-31 DIAGNOSIS — Z6822 Body mass index (BMI) 22.0-22.9, adult: Secondary | ICD-10-CM | POA: Diagnosis not present

## 2018-03-31 DIAGNOSIS — Z299 Encounter for prophylactic measures, unspecified: Secondary | ICD-10-CM | POA: Diagnosis not present

## 2018-03-31 DIAGNOSIS — F0390 Unspecified dementia without behavioral disturbance: Secondary | ICD-10-CM | POA: Diagnosis not present

## 2018-03-31 DIAGNOSIS — I10 Essential (primary) hypertension: Secondary | ICD-10-CM | POA: Diagnosis not present

## 2018-07-17 DIAGNOSIS — E7800 Pure hypercholesterolemia, unspecified: Secondary | ICD-10-CM | POA: Diagnosis not present

## 2018-07-17 DIAGNOSIS — I10 Essential (primary) hypertension: Secondary | ICD-10-CM | POA: Diagnosis not present

## 2018-09-10 DIAGNOSIS — E7800 Pure hypercholesterolemia, unspecified: Secondary | ICD-10-CM | POA: Diagnosis not present

## 2018-09-10 DIAGNOSIS — I10 Essential (primary) hypertension: Secondary | ICD-10-CM | POA: Diagnosis not present

## 2018-11-04 DIAGNOSIS — E7800 Pure hypercholesterolemia, unspecified: Secondary | ICD-10-CM | POA: Diagnosis not present

## 2018-11-04 DIAGNOSIS — I10 Essential (primary) hypertension: Secondary | ICD-10-CM | POA: Diagnosis not present

## 2018-12-02 DIAGNOSIS — R5383 Other fatigue: Secondary | ICD-10-CM | POA: Diagnosis not present

## 2018-12-02 DIAGNOSIS — Z1331 Encounter for screening for depression: Secondary | ICD-10-CM | POA: Diagnosis not present

## 2018-12-02 DIAGNOSIS — Z0000 Encounter for general adult medical examination without abnormal findings: Secondary | ICD-10-CM | POA: Diagnosis not present

## 2018-12-02 DIAGNOSIS — I10 Essential (primary) hypertension: Secondary | ICD-10-CM | POA: Diagnosis not present

## 2018-12-02 DIAGNOSIS — Z1339 Encounter for screening examination for other mental health and behavioral disorders: Secondary | ICD-10-CM | POA: Diagnosis not present

## 2018-12-02 DIAGNOSIS — Z299 Encounter for prophylactic measures, unspecified: Secondary | ICD-10-CM | POA: Diagnosis not present

## 2018-12-02 DIAGNOSIS — Z1211 Encounter for screening for malignant neoplasm of colon: Secondary | ICD-10-CM | POA: Diagnosis not present

## 2018-12-02 DIAGNOSIS — Z7189 Other specified counseling: Secondary | ICD-10-CM | POA: Diagnosis not present

## 2018-12-02 DIAGNOSIS — M25551 Pain in right hip: Secondary | ICD-10-CM | POA: Diagnosis not present

## 2018-12-02 DIAGNOSIS — Z6822 Body mass index (BMI) 22.0-22.9, adult: Secondary | ICD-10-CM | POA: Diagnosis not present

## 2018-12-02 DIAGNOSIS — E7800 Pure hypercholesterolemia, unspecified: Secondary | ICD-10-CM | POA: Diagnosis not present

## 2018-12-04 DIAGNOSIS — E7800 Pure hypercholesterolemia, unspecified: Secondary | ICD-10-CM | POA: Diagnosis not present

## 2018-12-16 DIAGNOSIS — Z23 Encounter for immunization: Secondary | ICD-10-CM | POA: Diagnosis not present

## 2018-12-16 DIAGNOSIS — R413 Other amnesia: Secondary | ICD-10-CM | POA: Diagnosis not present

## 2018-12-18 DIAGNOSIS — E7800 Pure hypercholesterolemia, unspecified: Secondary | ICD-10-CM | POA: Diagnosis not present

## 2018-12-30 DIAGNOSIS — F0390 Unspecified dementia without behavioral disturbance: Secondary | ICD-10-CM | POA: Diagnosis not present

## 2018-12-30 DIAGNOSIS — I10 Essential (primary) hypertension: Secondary | ICD-10-CM | POA: Diagnosis not present

## 2018-12-30 DIAGNOSIS — Z713 Dietary counseling and surveillance: Secondary | ICD-10-CM | POA: Diagnosis not present

## 2018-12-30 DIAGNOSIS — Z681 Body mass index (BMI) 19 or less, adult: Secondary | ICD-10-CM | POA: Diagnosis not present

## 2018-12-30 DIAGNOSIS — Z299 Encounter for prophylactic measures, unspecified: Secondary | ICD-10-CM | POA: Diagnosis not present

## 2018-12-30 DIAGNOSIS — R413 Other amnesia: Secondary | ICD-10-CM | POA: Diagnosis not present

## 2019-01-11 DIAGNOSIS — I10 Essential (primary) hypertension: Secondary | ICD-10-CM | POA: Diagnosis not present

## 2019-01-14 DIAGNOSIS — H524 Presbyopia: Secondary | ICD-10-CM | POA: Diagnosis not present

## 2019-01-14 DIAGNOSIS — H43813 Vitreous degeneration, bilateral: Secondary | ICD-10-CM | POA: Diagnosis not present

## 2019-01-14 DIAGNOSIS — H25813 Combined forms of age-related cataract, bilateral: Secondary | ICD-10-CM | POA: Diagnosis not present

## 2019-01-14 DIAGNOSIS — H52223 Regular astigmatism, bilateral: Secondary | ICD-10-CM | POA: Diagnosis not present

## 2019-01-14 DIAGNOSIS — H04123 Dry eye syndrome of bilateral lacrimal glands: Secondary | ICD-10-CM | POA: Diagnosis not present

## 2019-01-14 DIAGNOSIS — H5203 Hypermetropia, bilateral: Secondary | ICD-10-CM | POA: Diagnosis not present

## 2019-01-16 NOTE — Progress Notes (Signed)
Subjective:    Patient ID: Joshua Wilkerson, male    DOB: 05-30-1927, 85 y.o.   MRN: [id number]  HPI Joshua Wilkerson is an 85 year old male with a past medical history of hypertension, prostate cancer s/p prostatectomy 1989, GERD, dysphagia and rectal adenocarcinoma s/p APR in 2007.  He presents today for further evaluation regarding weight loss and heme positive stool.  He was seen by his PCP in August and heme cards were ordered.  The patient completed the stool cards which were heme positive.  Patient collected the stool specimen from his colostomy bag.  He reports losing 20 pounds over the past 3 years.  His weight loss started after his wife passed away.  He lives alone but remains quite active.  He presents today with his son Joshua Wilkerson.  He eats to meals daily.  He denies having any any fever, sweats or chills.  No dysphasia, heartburn or stomach pain.  No nausea.  No lower abdominal pain.  His colostomy is emptied several times during the day.  His stool is brown soft to loose in consistency depend upon what he eats.  If he eats fruits he has increased loose stools.  No obvious blood in his stool.  No melena.  Remains on omeprazole 20 mg once daily.  His most recent colonoscopy was 11/26/2012 which identified a 3 mm tubular adenomatous polyp from the transverse colon, no other abnormalities were identified.  A repeat colonoscopy in 4 years was to be considered depending upon his health at that time.   Past Medical History:  Diagnosis Date  . BPH (benign prostatic hyperplasia)   . Colon cancer ([place])   . Esophageal reflux   . Essential hypertension, benign   . Inguinal hernia   . Prostate cancer [facility])    Past Surgical History:  Procedure Laterality Date  . COLON SURGERY  2007   [facility]  . COLONOSCOPY N/A 11/26/2012   Procedure: COLONOSCOPY;  Surgeon: Rogene Houston, MD;  [facility];  Service: Endoscopy;  Laterality: N/A;  . HERNIA REPAIR  [id number]   Umbilical  . HERNIA REPAIR  2006    Inguinal  . PROSTATE SURGERY  1989   [facility]   Current Outpatient Medications on File Prior to Visit  Medication Sig Dispense Refill  . acetaminophen (TYLENOL) 325 MG tablet Take 325 mg by mouth every 6 (six) hours as needed for pain.     Marland Kitchen amLODipine (NORVASC) 5 MG tablet Take 5 mg by mouth daily.    Marland Kitchen atorvastatin (LIPITOR) 10 MG tablet Take 10 mg by mouth. Patient takes 1 every night after supper.    . docusate sodium (COLACE) 100 MG capsule Take 2 capsules (200 mg total) by mouth at bedtime. 10 capsule 0  . donepezil (ARICEPT) 5 MG tablet Take 5 mg by mouth at bedtime.     . Ferrous Gluconate 324 (37.5 Fe) MG TABS Take 324 mg by mouth daily.    . Multiple Vitamins-Minerals (CENTRUM SILVER PO) Take 1 tablet by mouth daily.    Marland Kitchen omeprazole (PRILOSEC) 20 MG capsule Take 20 mg by mouth daily.     No current facility-administered medications on file prior to visit.    Allergies  Allergen Reactions  . Diclofenac   . Penicillins Itching    Review of Systems see HPI, all other systems reviewed and are negative    Objective:   Physical Exam  Blood pressure (!) 147/75, pulse 62, temperature 98 F (36.7  C), temperature source Oral, height 5\' 10"  (1.778 m), weight 131 lb 3.2 oz (59.5 kg). General: 85 year old African-American thin male in no acute distress Eyes: Sclera nonicteric, conjunctiva pink Neck: Supple, no lymphadenopathy or thyromegaly Heart: Irregular rhythm, no murmurs Lungs: Breath sounds clear throughout Abdomen: Soft, nontender, colostomy to the left lower quadrant, the stoma site is beefy red and intact, ostomy bag with soft brown-greenish stool, palpable thickened bowel left of the colostomy which somewhat decompresses with palpation, this area is slightly tender, questionable stoma hernia verses thickened bowel out a discrete mass palpated Extremities: No edema Neuro: Slightly hard of hearing, alert and oriented x4, speech is clear, patient answers questions  appropriately    Assessment & Plan:   36.  85 year old male with a past medical history of rectal adenocarcinoma  s/p APR in 2007 with colostomy presents for further evaluation regarding heme positive stool and 20 pound weight loss  -Abdominal/pelvic CT with oral and IV contrast to rule out a malignant process to the abdomen, rule out stoma hernia versus thickened bowel left of the colostomy -Patient to go to the lab today for CBC and CMP.  Once I have his BUN/creatinine level I will order the above CT -To review the above laboratory and CAT scan results before determining if a colonoscopy should be done  2.  History of GERD well-controlled on omeprazole 20 mg once daily  3.  History of prostate cancer

## 2019-01-18 ENCOUNTER — Other Ambulatory Visit: Payer: Self-pay

## 2019-01-18 ENCOUNTER — Encounter (INDEPENDENT_AMBULATORY_CARE_PROVIDER_SITE_OTHER): Payer: Self-pay | Admitting: Nurse Practitioner

## 2019-01-18 ENCOUNTER — Ambulatory Visit (INDEPENDENT_AMBULATORY_CARE_PROVIDER_SITE_OTHER): Payer: Medicare Other | Admitting: Nurse Practitioner

## 2019-01-18 DIAGNOSIS — R634 Abnormal weight loss: Secondary | ICD-10-CM

## 2019-01-18 DIAGNOSIS — R195 Other fecal abnormalities: Secondary | ICD-10-CM | POA: Diagnosis not present

## 2019-01-18 DIAGNOSIS — Z85038 Personal history of other malignant neoplasm of large intestine: Secondary | ICD-10-CM

## 2019-01-18 NOTE — Patient Instructions (Signed)
1. Complete the above lab tests today  2. Our office will call you in the next 1 to 2 days to schedule an abdominal/pelvic CT scan.   3. Follow up in office in 6 weeks

## 2019-01-19 ENCOUNTER — Telehealth (INDEPENDENT_AMBULATORY_CARE_PROVIDER_SITE_OTHER): Payer: Self-pay | Admitting: Nurse Practitioner

## 2019-01-19 ENCOUNTER — Other Ambulatory Visit (INDEPENDENT_AMBULATORY_CARE_PROVIDER_SITE_OTHER): Payer: Self-pay | Admitting: Nurse Practitioner

## 2019-01-19 DIAGNOSIS — Z85038 Personal history of other malignant neoplasm of large intestine: Secondary | ICD-10-CM

## 2019-01-19 DIAGNOSIS — R634 Abnormal weight loss: Secondary | ICD-10-CM

## 2019-01-19 LAB — CBC WITH DIFFERENTIAL/PLATELET
Absolute Monocytes: 343 cells/uL (ref 200–950)
Basophils Absolute: 21 cells/uL (ref 0–200)
Basophils Relative: 0.4 %
Eosinophils Absolute: 42 cells/uL (ref 15–500)
Eosinophils Relative: 0.8 %
HCT: 34.5 % — ABNORMAL LOW (ref 38.5–50.0)
Hemoglobin: 11.6 g/dL — ABNORMAL LOW (ref 13.2–17.1)
Lymphs Abs: 811 cells/uL — ABNORMAL LOW (ref 850–3900)
MCH: 32.7 pg (ref 27.0–33.0)
MCHC: 33.6 g/dL (ref 32.0–36.0)
MCV: 97.2 fL (ref 80.0–100.0)
MPV: 12.2 fL (ref 7.5–12.5)
Monocytes Relative: 6.6 %
Neutro Abs: 3983 cells/uL (ref 1500–7800)
Neutrophils Relative %: 76.6 %
Platelets: 148 10*3/uL (ref 140–400)
RBC: 3.55 10*6/uL — ABNORMAL LOW (ref 4.20–5.80)
RDW: 10.5 % — ABNORMAL LOW (ref 11.0–15.0)
Total Lymphocyte: 15.6 %
WBC: 5.2 10*3/uL (ref 3.8–10.8)

## 2019-01-19 LAB — COMPLETE METABOLIC PANEL WITH GFR
AG Ratio: 1.8 (calc) (ref 1.0–2.5)
ALT: 25 U/L (ref 9–46)
AST: 28 U/L (ref 10–35)
Albumin: 4.1 g/dL (ref 3.6–5.1)
Alkaline phosphatase (APISO): 65 U/L (ref 35–144)
BUN/Creatinine Ratio: 13 (calc) (ref 6–22)
BUN: 15 mg/dL (ref 7–25)
CO2: 25 mmol/L (ref 20–32)
Calcium: 9.1 mg/dL (ref 8.6–10.3)
Chloride: 107 mmol/L (ref 98–110)
Creat: 1.12 mg/dL — ABNORMAL HIGH (ref 0.70–1.11)
GFR, Est African American: 67 mL/min/{1.73_m2} (ref >=60)
GFR, Est Non African American: 58 mL/min/{1.73_m2} — ABNORMAL LOW (ref >=60)
Globulin: 2.3 g/dL (calc) (ref 1.9–3.7)
Glucose, Bld: 83 mg/dL (ref 65–139)
Potassium: 4.5 mmol/L (ref 3.5–5.3)
Sodium: 142 mmol/L (ref 135–146)
Total Bilirubin: 0.6 mg/dL (ref 0.2–1.2)
Total Protein: 6.4 g/dL (ref 6.1–8.1)

## 2019-01-19 NOTE — Telephone Encounter (Signed)
Ann, pls contact patient to schedule an abd/pelvic CT with oral and IV contrast. Refer to office visit 10/5. thx

## 2019-01-19 NOTE — Telephone Encounter (Signed)
CT sch'd 02/02/19 at 400 (345 ), npo 4 hrs, pick up contrast, patient aware

## 2019-02-02 ENCOUNTER — Other Ambulatory Visit: Payer: Self-pay

## 2019-02-02 ENCOUNTER — Ambulatory Visit (HOSPITAL_COMMUNITY)
Admission: RE | Admit: 2019-02-02 | Discharge: 2019-02-02 | Disposition: A | Discharge status: Home / Self Care | Payer: Medicare Other | Source: Ambulatory Visit | Attending: Nurse Practitioner | Admitting: Nurse Practitioner

## 2019-02-02 DIAGNOSIS — R634 Abnormal weight loss: Secondary | ICD-10-CM | POA: Diagnosis not present

## 2019-02-02 DIAGNOSIS — K8020 Calculus of gallbladder without cholecystitis without obstruction: Secondary | ICD-10-CM | POA: Diagnosis not present

## 2019-02-02 DIAGNOSIS — Z85038 Personal history of other malignant neoplasm of large intestine: Secondary | ICD-10-CM | POA: Insufficient documentation

## 2019-02-02 MED ORDER — IOHEXOL 300 MG/ML  SOLN
100.0000 mL | Freq: Once | INTRAMUSCULAR | Status: AC | PRN
  Administered 2019-02-02: 16:00:00 100 mL via INTRAVENOUS

## 2019-02-03 ENCOUNTER — Other Ambulatory Visit (INDEPENDENT_AMBULATORY_CARE_PROVIDER_SITE_OTHER): Payer: Self-pay | Admitting: Nurse Practitioner

## 2019-02-03 DIAGNOSIS — E7800 Pure hypercholesterolemia, unspecified: Secondary | ICD-10-CM | POA: Diagnosis not present

## 2019-02-03 DIAGNOSIS — Z85038 Personal history of other malignant neoplasm of large intestine: Secondary | ICD-10-CM

## 2019-02-03 DIAGNOSIS — R634 Abnormal weight loss: Secondary | ICD-10-CM

## 2019-02-05 ENCOUNTER — Telehealth: Payer: Self-pay | Admitting: Cardiology

## 2019-02-05 NOTE — Telephone Encounter (Signed)
Virtual Visit Pre-Appointment Phone Call  "(Name), I am calling you today to discuss your upcoming appointment. We are currently trying to limit exposure to the virus that causes COVID-19 by seeing patients at home rather than in the office."  1. "What is the BEST phone number to call the day of the visit?" - include this in appointment notes  2. Do you have or have access to (through a family member/friend) a smartphone with video capability that we can use for your visit?" a. If yes - list this number in appt notes as cell (if different from BEST phone #) and list the appointment type as a VIDEO visit in appointment notes b. If no - list the appointment type as a PHONE visit in appointment notes  3. Confirm consent - "In the setting of the current Covid19 crisis, you are scheduled for a (phone or video) visit with your provider on (date) at (time).  Just as we do with many in-office visits, in order for you to participate in this visit, we must obtain consent.  If you'd like, I can send this to your mychart (if signed up) or email for you to review.  Otherwise, I can obtain your verbal consent now.  All virtual visits are billed to your insurance company just like a normal visit would be.  By agreeing to a virtual visit, we'd like you to understand that the technology does not allow for your provider to perform an examination, and thus may limit your provider's ability to fully assess your condition. If your provider identifies any concerns that need to be evaluated in person, we will make arrangements to do so.  Finally, though the technology is pretty good, we cannot assure that it will always work on either your or our end, and in the setting of a video visit, we may have to convert it to a phone-only visit.  In either situation, we cannot ensure that we have a secure connection.  Are you willing to proceed?" STAFF: Did the patient verbally acknowledge consent to telehealth visit? Document  YES/NO here: yes  4. Advise patient to be prepared - "Two hours prior to your appointment, go ahead and check your blood pressure, pulse, oxygen saturation, and your weight (if you have the equipment to check those) and write them all down. When your visit starts, your provider will ask you for this information. If you have an Apple Watch or Kardia device, please plan to have heart rate information ready on the day of your appointment. Please have a pen and paper handy nearby the day of the visit as well."  5. Give patient instructions for MyChart download to smartphone OR Doximity/Doxy.me as below if video visit (depending on what platform provider is using)  6. Inform patient they will receive a phone call 15 minutes prior to their appointment time (may be from unknown caller ID) so they should be prepared to answer    TELEPHONE CALL NOTE  Joshua Wilkerson has been deemed a candidate for a follow-up tele-health visit to limit community exposure during the Covid-19 pandemic. I spoke with the patient via phone to ensure availability of phone/video source, confirm preferred email & phone number, and discuss instructions and expectations.  I reminded Joshua Wilkerson to be prepared with any vital sign and/or heart rhythm information that could potentially be obtained via home monitoring, at the time of his visit. I reminded Joshua Wilkerson to expect a phone call prior to  his visit.  Joshua Wilkerson 02/05/2019 9:45 AM   INSTRUCTIONS FOR DOWNLOADING THE MYCHART APP TO SMARTPHONE  - The patient must first make sure to have activated MyChart and know their login information - If Apple, go to [employer] and type in MyChart in the search bar and download the app. If Android, ask patient to go to [employer] and type in [place] in the search bar and download the app. The app is free but as with any other app downloads, their phone may require them to verify saved payment information or Apple/Android  password.  - The patient will need to then log into the app with their MyChart username and password, and select [facility] as their healthcare provider to link the account. When it is time for your visit, go to the MyChart app, find appointments, and click Begin Video Visit. Be sure to Select Allow for your device to access the Microphone and Camera for your visit. You will then be connected, and your provider will be with you shortly.  **If they have any issues connecting, or need assistance please contact MyChart service desk [phone])**  **If using a computer, in order to ensure the best quality for their visit they will need to use either of the following Internet Browsers: [employer], or Google Chrome**  IF USING DOXIMITY or DOXY.ME - The patient will receive a link just prior to their visit by text.     FULL LENGTH CONSENT FOR TELE-HEALTH VISIT   I hereby voluntarily request, consent and authorize [facility] and its employed or contracted physicians, physician assistants, nurse practitioners or other licensed health care professionals (the Practitioner), to provide me with telemedicine health care services (the Services") as deemed necessary by the treating Practitioner. I acknowledge and consent to receive the Services by the Practitioner via telemedicine. I understand that the telemedicine visit will involve communicating with the Practitioner through live audiovisual communication technology and the disclosure of certain medical information by electronic transmission. I acknowledge that I have been given the opportunity to request an in-person assessment or other available alternative prior to the telemedicine visit and am voluntarily participating in the telemedicine visit.  I understand that I have the right to withhold or withdraw my consent to the use of telemedicine in the course of my care at any time, without affecting my right to future care or treatment,  and that the Practitioner or I may terminate the telemedicine visit at any time. I understand that I have the right to inspect all information obtained and/or recorded in the course of the telemedicine visit and may receive copies of available information for a reasonable fee.  I understand that some of the potential risks of receiving the Services via telemedicine include:   Delay or interruption in medical evaluation due to technological equipment failure or disruption;  Information transmitted may not be sufficient (e.g. poor resolution of images) to allow for appropriate medical decision making by the Practitioner; and/or   In rare instances, security protocols could fail, causing a breach of personal health information.  Furthermore, I acknowledge that it is my responsibility to provide information about my medical history, conditions and care that is complete and accurate to the best of my ability. I acknowledge that Practitioner's advice, recommendations, and/or decision may be based on factors not within their control, such as incomplete or inaccurate data provided by me or distortions of diagnostic images or specimens that may result from electronic transmissions. I  understand that the practice of medicine is not an exact science and that Practitioner makes no warranties or guarantees regarding treatment outcomes. I acknowledge that I will receive a copy of this consent concurrently upon execution via email to the email address I last provided but may also request a printed copy by calling the office of [facility].    I understand that my insurance will be billed for this visit.   I have read or had this consent read to me.  I understand the contents of this consent, which adequately explains the benefits and risks of the Services being provided via telemedicine.   I have been provided ample opportunity to ask questions regarding this consent and the Services and have had my questions  answered to my satisfaction.  I give my informed consent for the services to be provided through the use of telemedicine in my medical care  By participating in this telemedicine visit I agree to the above.

## 2019-02-10 DIAGNOSIS — I10 Essential (primary) hypertension: Secondary | ICD-10-CM | POA: Diagnosis not present

## 2019-02-11 ENCOUNTER — Ambulatory Visit (INDEPENDENT_AMBULATORY_CARE_PROVIDER_SITE_OTHER): Payer: Medicare Other | Admitting: Cardiology

## 2019-02-11 ENCOUNTER — Other Ambulatory Visit (INDEPENDENT_AMBULATORY_CARE_PROVIDER_SITE_OTHER): Payer: Self-pay | Admitting: Nurse Practitioner

## 2019-02-11 ENCOUNTER — Other Ambulatory Visit: Payer: Self-pay

## 2019-02-11 ENCOUNTER — Encounter: Payer: Self-pay | Admitting: Cardiology

## 2019-02-11 VITALS — BP 135/69 | HR 62 | Ht 70.0 in | Wt 130.0 lb

## 2019-02-11 DIAGNOSIS — E782 Mixed hyperlipidemia: Secondary | ICD-10-CM

## 2019-02-11 DIAGNOSIS — I208 Other forms of angina pectoris: Secondary | ICD-10-CM

## 2019-02-11 DIAGNOSIS — I10 Essential (primary) hypertension: Secondary | ICD-10-CM

## 2019-02-11 DIAGNOSIS — K435 Parastomal hernia without obstruction or  gangrene: Secondary | ICD-10-CM

## 2019-02-11 NOTE — Progress Notes (Signed)
Cardiology Office Note  Date: 02/11/2019   ID: REPHAEL, BRINCEFIELD Dec 23, 1927, MRN [id number]  PCP:  Glenda Chroman, MD  Cardiologist:  Rozann Lesches, MD Electrophysiologist:  None   Chief Complaint  Patient presents with  . Cardiac follow-up    History of Present Illness: Joshua Wilkerson is an 85 y.o. male last seen in October 2019.  He presents for a routine office visit.  He does not report any significant angina symptoms, still having trouble with left shoulder discomfort that is more arthritic in nature.  He remains functional with ADLs including outside chores, mowing the yard, bringing in wood.  I reviewed his medications which are outlined below.  No major change in cardiac regimen.  He continues to see Dr. Woody Seller for primary care.  I personally reviewed his ECG today which shows sinus bradycardia with R prime in lead V1.   Past Medical History:  Diagnosis Date  . BPH (benign prostatic hyperplasia)   . Colon cancer ([place])   . Esophageal reflux   . Essential hypertension   . Inguinal hernia   . Prostate cancer [facility])     Past Surgical History:  Procedure Laterality Date  . COLON SURGERY  2007   [facility]  . COLONOSCOPY N/A 11/26/2012   Procedure: COLONOSCOPY;  Surgeon: Rogene Houston, MD;  Location: [facility];  Service: Endoscopy;  Laterality: N/A;  . HERNIA REPAIR  [id number]   [facility]  . HERNIA REPAIR  2006   Ingu[facility]  . PROSTATE SURGERY  1989   [facility]    Current Outpatient Medications  Medication Sig Dispense Refill  . acetaminophen (TYLENOL) 325 MG tablet Take 325 mg by mouth every 6 (six) hours as needed for pain.     Marland Kitchen amLODipine (NORVASC) 5 MG tablet Take 5 mg by mouth daily.    Marland Kitchen atorvastatin (LIPITOR) 10 MG tablet Take 10 mg by mouth. Patient takes 1 every night after supper.    . docusate sodium (COLACE) 100 MG capsule Take 2 capsules (200 mg total) by mouth at bedtime. 10 capsule 0  . donepezil (ARICEPT) 5 MG tablet Take 5 mg by mouth at bedtime.      . Ferrous Gluconate 324 (37.5 Fe) MG TABS Take 324 mg by mouth daily.    . Multiple Vitamins-Minerals (CENTRUM SILVER PO) Take 1 tablet by mouth daily.    Marland Kitchen omeprazole (PRILOSEC) 20 MG capsule Take 20 mg by mouth daily.     No current facility-administered medications for this visit.    Allergies:  Diclofenac and Penicillins   Social History: The patient  reports that he quit smoking about 65 years ago. His smoking use included cigarettes. He started smoking about 67 years ago. He has a 1.50 pack-year smoking history. He has never used smokeless tobacco. He reports that he does not drink alcohol or use drugs.   ROS:  Please see the history of present illness. Otherwise, complete review of systems is positive for hearing loss.  All other systems are reviewed and negative.   Physical Exam: VS:  BP 135/69   Pulse 62   Ht 5\' 10"  (1.778 m)   Wt 130 lb (59 kg)   SpO2 98%   BMI 18.65 kg/m , BMI Body mass index is 18.65 kg/m.  Wt Readings from Last 3 Encounters:  02/11/19 130 lb (59 kg)  01/18/19 131 lb 3.2 oz (59.5 kg)  01/13/18 149 lb 3.2 oz (67.7 kg)    General: Patient appears  comfortable at rest. HEENT: Conjunctiva and lids normal, wearing a mask. Neck: Supple, no elevated JVP or carotid bruits, no thyromegaly. Lungs: Clear to auscultation, nonlabored breathing at rest. Cardiac: Regular rate and rhythm, no S3 or significant systolic murmur. Abdomen: Soft, nontender, bowel sounds present. Extremities: No pitting edema, distal pulses 2+. Skin: Warm and dry. Musculoskeletal: No kyphosis. Neuropsychiatric: Alert and oriented x3, affect grossly appropriate.  Hearing loss evident.  ECG:  An ECG dated 01/13/2018 was personally reviewed today and demonstrated:  Sinus rhythm with prolonged PR interval and right bundle branch block.  Recent Labwork: 01/18/2019: ALT 25; AST 28; BUN 15; Creat 1.12; Hemoglobin 11.6; Platelets 148; Potassium 4.5; Sodium 142  February 2019: Cholesterol 146,  triglycerides 65, HDL 55, LDL 78  Other Studies Reviewed Today:  Exercise Cardiolite 12/12/2014:  There was no ST segment deviation noted during stress.  The study is normal. There are no defects to suggest infarct or ischemia  This is a low risk study.  The left ventricular ejection fraction is hyperdynamic (>65%).  Echocardiogram [facility]) April 2014: Mild LVH with LVEF 0000000, diastolic dysfunction, trace mitral regurgitation, mild aortic regurgitation, mild tricuspid regurgitation.  Assessment and Plan:  1.  History of suspected exertional angina and potential underlying ischemic heart disease with low risk objective ischemic testing in the past.  He does not report any new symptoms, ECG reviewed and stable.  Would continue aspirin, Norvasc, and Lipitor.  2.  Essential hypertension, he continues to follow with Dr. Woody Seller.  Systolic is in the [id number] today.  3.  Hyperlipidemia on Lipitor.  Last LDL 78.  Medication Adjustments/Labs and Tests Ordered: Current medicines are reviewed at length with the patient today.  Concerns regarding medicines are outlined above.   Tests Ordered: Orders Placed This Encounter  Procedures  . EKG 12-Lead    Medication Changes: No orders of the defined types were placed in this encounter.   Disposition:  Follow up 1 year in the [place] office.  Signed, Satira Sark, MD, [facility] 02/11/2019 2:12 PM    [facility] [place], [place], [place] [address] Phone: [phone]; Fax: [phone]

## 2019-02-11 NOTE — Patient Instructions (Addendum)

## 2019-02-15 DIAGNOSIS — Z299 Encounter for prophylactic measures, unspecified: Secondary | ICD-10-CM | POA: Diagnosis not present

## 2019-02-15 DIAGNOSIS — F0390 Unspecified dementia without behavioral disturbance: Secondary | ICD-10-CM | POA: Diagnosis not present

## 2019-02-15 DIAGNOSIS — E7800 Pure hypercholesterolemia, unspecified: Secondary | ICD-10-CM | POA: Diagnosis not present

## 2019-02-15 DIAGNOSIS — I10 Essential (primary) hypertension: Secondary | ICD-10-CM | POA: Diagnosis not present

## 2019-02-15 DIAGNOSIS — Z681 Body mass index (BMI) 19 or less, adult: Secondary | ICD-10-CM | POA: Diagnosis not present

## 2019-02-15 DIAGNOSIS — Z939 Artificial opening status, unspecified: Secondary | ICD-10-CM | POA: Diagnosis not present

## 2019-03-01 DIAGNOSIS — K435 Parastomal hernia without obstruction or  gangrene: Secondary | ICD-10-CM | POA: Diagnosis not present

## 2019-03-01 DIAGNOSIS — K432 Incisional hernia without obstruction or gangrene: Secondary | ICD-10-CM | POA: Diagnosis not present

## 2019-03-01 DIAGNOSIS — R634 Abnormal weight loss: Secondary | ICD-10-CM | POA: Diagnosis not present

## 2019-03-01 DIAGNOSIS — K4090 Unilateral inguinal hernia, without obstruction or gangrene, not specified as recurrent: Secondary | ICD-10-CM | POA: Diagnosis not present

## 2019-03-31 ENCOUNTER — Ambulatory Visit (INDEPENDENT_AMBULATORY_CARE_PROVIDER_SITE_OTHER): Payer: Medicare Other | Admitting: Nurse Practitioner

## 2019-04-07 DIAGNOSIS — E7800 Pure hypercholesterolemia, unspecified: Secondary | ICD-10-CM | POA: Diagnosis not present

## 2019-05-18 DIAGNOSIS — F0390 Unspecified dementia without behavioral disturbance: Secondary | ICD-10-CM | POA: Diagnosis not present

## 2019-05-18 DIAGNOSIS — Z299 Encounter for prophylactic measures, unspecified: Secondary | ICD-10-CM | POA: Diagnosis not present

## 2019-05-18 DIAGNOSIS — Z939 Artificial opening status, unspecified: Secondary | ICD-10-CM | POA: Diagnosis not present

## 2019-05-18 DIAGNOSIS — Z681 Body mass index (BMI) 19 or less, adult: Secondary | ICD-10-CM | POA: Diagnosis not present

## 2019-05-18 DIAGNOSIS — I10 Essential (primary) hypertension: Secondary | ICD-10-CM | POA: Diagnosis not present

## 2019-05-18 DIAGNOSIS — R21 Rash and other nonspecific skin eruption: Secondary | ICD-10-CM | POA: Diagnosis not present

## 2019-06-10 DIAGNOSIS — Z23 Encounter for immunization: Secondary | ICD-10-CM | POA: Diagnosis not present

## 2019-06-17 DIAGNOSIS — E7800 Pure hypercholesterolemia, unspecified: Secondary | ICD-10-CM | POA: Diagnosis not present

## 2019-08-03 DIAGNOSIS — E7800 Pure hypercholesterolemia, unspecified: Secondary | ICD-10-CM | POA: Diagnosis not present

## 2019-10-13 DIAGNOSIS — E7800 Pure hypercholesterolemia, unspecified: Secondary | ICD-10-CM | POA: Diagnosis not present

## 2019-11-15 DIAGNOSIS — E7800 Pure hypercholesterolemia, unspecified: Secondary | ICD-10-CM | POA: Diagnosis not present

## 2019-12-09 DIAGNOSIS — Z299 Encounter for prophylactic measures, unspecified: Secondary | ICD-10-CM | POA: Diagnosis not present

## 2019-12-09 DIAGNOSIS — Z7189 Other specified counseling: Secondary | ICD-10-CM | POA: Diagnosis not present

## 2019-12-09 DIAGNOSIS — Z1331 Encounter for screening for depression: Secondary | ICD-10-CM | POA: Diagnosis not present

## 2019-12-09 DIAGNOSIS — I10 Essential (primary) hypertension: Secondary | ICD-10-CM | POA: Diagnosis not present

## 2019-12-09 DIAGNOSIS — E7800 Pure hypercholesterolemia, unspecified: Secondary | ICD-10-CM | POA: Diagnosis not present

## 2019-12-09 DIAGNOSIS — Z681 Body mass index (BMI) 19 or less, adult: Secondary | ICD-10-CM | POA: Diagnosis not present

## 2019-12-09 DIAGNOSIS — F0390 Unspecified dementia without behavioral disturbance: Secondary | ICD-10-CM | POA: Diagnosis not present

## 2019-12-09 DIAGNOSIS — Z79899 Other long term (current) drug therapy: Secondary | ICD-10-CM | POA: Diagnosis not present

## 2019-12-09 DIAGNOSIS — Z0000 Encounter for general adult medical examination without abnormal findings: Secondary | ICD-10-CM | POA: Diagnosis not present

## 2019-12-09 DIAGNOSIS — Z1339 Encounter for screening examination for other mental health and behavioral disorders: Secondary | ICD-10-CM | POA: Diagnosis not present

## 2019-12-09 DIAGNOSIS — Z125 Encounter for screening for malignant neoplasm of prostate: Secondary | ICD-10-CM | POA: Diagnosis not present

## 2019-12-09 DIAGNOSIS — R5383 Other fatigue: Secondary | ICD-10-CM | POA: Diagnosis not present

## 2020-01-13 DIAGNOSIS — E7800 Pure hypercholesterolemia, unspecified: Secondary | ICD-10-CM | POA: Diagnosis not present

## 2020-01-13 DIAGNOSIS — I10 Essential (primary) hypertension: Secondary | ICD-10-CM | POA: Diagnosis not present

## 2020-02-11 DIAGNOSIS — I10 Essential (primary) hypertension: Secondary | ICD-10-CM | POA: Diagnosis not present

## 2020-02-11 DIAGNOSIS — E7800 Pure hypercholesterolemia, unspecified: Secondary | ICD-10-CM | POA: Diagnosis not present

## 2020-03-08 DIAGNOSIS — Z939 Artificial opening status, unspecified: Secondary | ICD-10-CM | POA: Diagnosis not present

## 2020-03-08 DIAGNOSIS — Z789 Other specified health status: Secondary | ICD-10-CM | POA: Diagnosis not present

## 2020-03-08 DIAGNOSIS — Z299 Encounter for prophylactic measures, unspecified: Secondary | ICD-10-CM | POA: Diagnosis not present

## 2020-03-08 DIAGNOSIS — B354 Tinea corporis: Secondary | ICD-10-CM | POA: Diagnosis not present

## 2020-03-08 DIAGNOSIS — F0390 Unspecified dementia without behavioral disturbance: Secondary | ICD-10-CM | POA: Diagnosis not present

## 2020-03-08 DIAGNOSIS — I10 Essential (primary) hypertension: Secondary | ICD-10-CM | POA: Diagnosis not present

## 2020-05-10 DIAGNOSIS — I10 Essential (primary) hypertension: Secondary | ICD-10-CM | POA: Diagnosis not present

## 2020-05-10 DIAGNOSIS — Z299 Encounter for prophylactic measures, unspecified: Secondary | ICD-10-CM | POA: Diagnosis not present

## 2020-05-10 DIAGNOSIS — F0390 Unspecified dementia without behavioral disturbance: Secondary | ICD-10-CM | POA: Diagnosis not present

## 2020-05-10 DIAGNOSIS — Z939 Artificial opening status, unspecified: Secondary | ICD-10-CM | POA: Diagnosis not present

## 2020-09-13 DIAGNOSIS — E440 Moderate protein-calorie malnutrition: Secondary | ICD-10-CM | POA: Diagnosis not present

## 2020-09-13 DIAGNOSIS — F0390 Unspecified dementia without behavioral disturbance: Secondary | ICD-10-CM | POA: Diagnosis not present

## 2020-09-13 DIAGNOSIS — Z939 Artificial opening status, unspecified: Secondary | ICD-10-CM | POA: Diagnosis not present

## 2020-09-13 DIAGNOSIS — I10 Essential (primary) hypertension: Secondary | ICD-10-CM | POA: Diagnosis not present

## 2020-09-13 DIAGNOSIS — Z299 Encounter for prophylactic measures, unspecified: Secondary | ICD-10-CM | POA: Diagnosis not present

## 2020-11-20 DIAGNOSIS — Z7189 Other specified counseling: Secondary | ICD-10-CM | POA: Diagnosis not present

## 2020-11-20 DIAGNOSIS — Z1339 Encounter for screening examination for other mental health and behavioral disorders: Secondary | ICD-10-CM | POA: Diagnosis not present

## 2020-11-20 DIAGNOSIS — Z1331 Encounter for screening for depression: Secondary | ICD-10-CM | POA: Diagnosis not present

## 2020-11-20 DIAGNOSIS — Z299 Encounter for prophylactic measures, unspecified: Secondary | ICD-10-CM | POA: Diagnosis not present

## 2020-11-20 DIAGNOSIS — Z0000 Encounter for general adult medical examination without abnormal findings: Secondary | ICD-10-CM | POA: Diagnosis not present

## 2020-11-20 DIAGNOSIS — Z681 Body mass index (BMI) 19 or less, adult: Secondary | ICD-10-CM | POA: Diagnosis not present

## 2021-01-19 DIAGNOSIS — Z23 Encounter for immunization: Secondary | ICD-10-CM | POA: Diagnosis not present

## 2021-01-19 DIAGNOSIS — Z20828 Contact with and (suspected) exposure to other viral communicable diseases: Secondary | ICD-10-CM | POA: Diagnosis not present

## 2021-03-02 DIAGNOSIS — Z20828 Contact with and (suspected) exposure to other viral communicable diseases: Secondary | ICD-10-CM | POA: Diagnosis not present

## 2021-04-02 DIAGNOSIS — Z20828 Contact with and (suspected) exposure to other viral communicable diseases: Secondary | ICD-10-CM | POA: Diagnosis not present

## 2021-04-23 DIAGNOSIS — Z299 Encounter for prophylactic measures, unspecified: Secondary | ICD-10-CM | POA: Diagnosis not present

## 2021-04-23 DIAGNOSIS — E440 Moderate protein-calorie malnutrition: Secondary | ICD-10-CM | POA: Diagnosis not present

## 2021-04-23 DIAGNOSIS — I739 Peripheral vascular disease, unspecified: Secondary | ICD-10-CM | POA: Diagnosis not present

## 2021-04-23 DIAGNOSIS — I10 Essential (primary) hypertension: Secondary | ICD-10-CM | POA: Diagnosis not present

## 2021-04-23 DIAGNOSIS — F0390 Unspecified dementia without behavioral disturbance: Secondary | ICD-10-CM | POA: Diagnosis not present

## 2021-06-05 DIAGNOSIS — Z23 Encounter for immunization: Secondary | ICD-10-CM | POA: Diagnosis not present

## 2021-07-24 DIAGNOSIS — F0390 Unspecified dementia without behavioral disturbance: Secondary | ICD-10-CM | POA: Diagnosis not present

## 2021-07-24 DIAGNOSIS — R21 Rash and other nonspecific skin eruption: Secondary | ICD-10-CM | POA: Diagnosis not present

## 2021-07-24 DIAGNOSIS — E440 Moderate protein-calorie malnutrition: Secondary | ICD-10-CM | POA: Diagnosis not present

## 2021-07-24 DIAGNOSIS — Z299 Encounter for prophylactic measures, unspecified: Secondary | ICD-10-CM | POA: Diagnosis not present

## 2021-07-24 DIAGNOSIS — I10 Essential (primary) hypertension: Secondary | ICD-10-CM | POA: Diagnosis not present

## 2021-09-25 DIAGNOSIS — F0390 Unspecified dementia without behavioral disturbance: Secondary | ICD-10-CM | POA: Diagnosis not present

## 2021-09-25 DIAGNOSIS — Z299 Encounter for prophylactic measures, unspecified: Secondary | ICD-10-CM | POA: Diagnosis not present

## 2021-09-25 DIAGNOSIS — I10 Essential (primary) hypertension: Secondary | ICD-10-CM | POA: Diagnosis not present

## 2021-09-25 DIAGNOSIS — Z681 Body mass index (BMI) 19 or less, adult: Secondary | ICD-10-CM | POA: Diagnosis not present

## 2021-09-25 DIAGNOSIS — Z713 Dietary counseling and surveillance: Secondary | ICD-10-CM | POA: Diagnosis not present

## 2021-11-26 DIAGNOSIS — Z7189 Other specified counseling: Secondary | ICD-10-CM | POA: Diagnosis not present

## 2021-11-26 DIAGNOSIS — Z79899 Other long term (current) drug therapy: Secondary | ICD-10-CM | POA: Diagnosis not present

## 2021-11-26 DIAGNOSIS — Z1339 Encounter for screening examination for other mental health and behavioral disorders: Secondary | ICD-10-CM | POA: Diagnosis not present

## 2021-11-26 DIAGNOSIS — Z0000 Encounter for general adult medical examination without abnormal findings: Secondary | ICD-10-CM | POA: Diagnosis not present

## 2021-11-26 DIAGNOSIS — I10 Essential (primary) hypertension: Secondary | ICD-10-CM | POA: Diagnosis not present

## 2021-11-26 DIAGNOSIS — Z681 Body mass index (BMI) 19 or less, adult: Secondary | ICD-10-CM | POA: Diagnosis not present

## 2021-11-26 DIAGNOSIS — Z125 Encounter for screening for malignant neoplasm of prostate: Secondary | ICD-10-CM | POA: Diagnosis not present

## 2021-11-26 DIAGNOSIS — E7800 Pure hypercholesterolemia, unspecified: Secondary | ICD-10-CM | POA: Diagnosis not present

## 2021-11-26 DIAGNOSIS — R5383 Other fatigue: Secondary | ICD-10-CM | POA: Diagnosis not present

## 2021-11-26 DIAGNOSIS — Z1331 Encounter for screening for depression: Secondary | ICD-10-CM | POA: Diagnosis not present

## 2021-11-26 DIAGNOSIS — Z299 Encounter for prophylactic measures, unspecified: Secondary | ICD-10-CM | POA: Diagnosis not present

## 2022-02-05 DIAGNOSIS — Z23 Encounter for immunization: Secondary | ICD-10-CM | POA: Diagnosis not present

## 2022-06-06 DIAGNOSIS — F0390 Unspecified dementia without behavioral disturbance: Secondary | ICD-10-CM | POA: Diagnosis not present

## 2022-06-06 DIAGNOSIS — I10 Essential (primary) hypertension: Secondary | ICD-10-CM | POA: Diagnosis not present

## 2022-06-06 DIAGNOSIS — Z299 Encounter for prophylactic measures, unspecified: Secondary | ICD-10-CM | POA: Diagnosis not present

## 2022-06-06 DIAGNOSIS — I739 Peripheral vascular disease, unspecified: Secondary | ICD-10-CM | POA: Diagnosis not present

## 2022-06-06 DIAGNOSIS — E440 Moderate protein-calorie malnutrition: Secondary | ICD-10-CM | POA: Diagnosis not present

## 2022-12-03 DIAGNOSIS — Z79899 Other long term (current) drug therapy: Secondary | ICD-10-CM | POA: Diagnosis not present

## 2022-12-03 DIAGNOSIS — R5383 Other fatigue: Secondary | ICD-10-CM | POA: Diagnosis not present

## 2022-12-03 DIAGNOSIS — F0390 Unspecified dementia without behavioral disturbance: Secondary | ICD-10-CM | POA: Diagnosis not present

## 2022-12-03 DIAGNOSIS — Z0000 Encounter for general adult medical examination without abnormal findings: Secondary | ICD-10-CM | POA: Diagnosis not present

## 2022-12-03 DIAGNOSIS — E7800 Pure hypercholesterolemia, unspecified: Secondary | ICD-10-CM | POA: Diagnosis not present

## 2022-12-03 DIAGNOSIS — Z7189 Other specified counseling: Secondary | ICD-10-CM | POA: Diagnosis not present

## 2022-12-03 DIAGNOSIS — Z299 Encounter for prophylactic measures, unspecified: Secondary | ICD-10-CM | POA: Diagnosis not present

## 2022-12-03 DIAGNOSIS — Z1331 Encounter for screening for depression: Secondary | ICD-10-CM | POA: Diagnosis not present

## 2022-12-03 DIAGNOSIS — Z1339 Encounter for screening examination for other mental health and behavioral disorders: Secondary | ICD-10-CM | POA: Diagnosis not present

## 2022-12-03 DIAGNOSIS — I10 Essential (primary) hypertension: Secondary | ICD-10-CM | POA: Diagnosis not present

## 2023-04-22 DIAGNOSIS — F0390 Unspecified dementia without behavioral disturbance: Secondary | ICD-10-CM | POA: Diagnosis not present

## 2023-04-22 DIAGNOSIS — D692 Other nonthrombocytopenic purpura: Secondary | ICD-10-CM | POA: Diagnosis not present

## 2023-04-22 DIAGNOSIS — E440 Moderate protein-calorie malnutrition: Secondary | ICD-10-CM | POA: Diagnosis not present

## 2023-04-22 DIAGNOSIS — I739 Peripheral vascular disease, unspecified: Secondary | ICD-10-CM | POA: Diagnosis not present

## 2023-04-24 DIAGNOSIS — I959 Hypotension, unspecified: Secondary | ICD-10-CM | POA: Diagnosis not present

## 2023-04-24 DIAGNOSIS — F0390 Unspecified dementia without behavioral disturbance: Secondary | ICD-10-CM | POA: Diagnosis not present

## 2023-04-24 DIAGNOSIS — N400 Enlarged prostate without lower urinary tract symptoms: Secondary | ICD-10-CM | POA: Diagnosis not present

## 2023-04-24 DIAGNOSIS — E440 Moderate protein-calorie malnutrition: Secondary | ICD-10-CM | POA: Diagnosis not present

## 2023-05-17 DEATH — deceased
